# Patient Record
Sex: Female | Born: 1940
Health system: Southern US, Community
[De-identification: ages and names within clinical notes are randomized; demographics above are authoritative.]

## PROBLEM LIST (undated history)

## (undated) DIAGNOSIS — I10 Essential (primary) hypertension: Secondary | ICD-10-CM

## (undated) DIAGNOSIS — E039 Hypothyroidism, unspecified: Secondary | ICD-10-CM

## (undated) HISTORY — DX: Hypothyroidism, unspecified: E03.9

## (undated) HISTORY — DX: Essential (primary) hypertension: I10

---

## 2005-04-19 ENCOUNTER — Inpatient Hospital Stay (HOSPITAL_COMMUNITY): Admission: EM | Admit: 2005-04-19 | Discharge: 2005-04-24 | Payer: Self-pay | Admitting: Emergency Medicine

## 2005-04-20 ENCOUNTER — Ambulatory Visit: Payer: Self-pay | Admitting: Cardiology

## 2012-04-21 ENCOUNTER — Other Ambulatory Visit: Payer: Self-pay | Admitting: Family Medicine

## 2012-04-21 DIAGNOSIS — E049 Nontoxic goiter, unspecified: Secondary | ICD-10-CM

## 2012-05-06 ENCOUNTER — Ambulatory Visit
Admission: RE | Admit: 2012-05-06 | Discharge: 2012-05-06 | Disposition: A | Discharge status: Home / Self Care | Payer: Medicare Other | Source: Ambulatory Visit | Attending: Family Medicine | Admitting: Family Medicine

## 2012-05-06 DIAGNOSIS — E049 Nontoxic goiter, unspecified: Secondary | ICD-10-CM

## 2012-05-19 ENCOUNTER — Other Ambulatory Visit: Payer: Self-pay | Admitting: Family Medicine

## 2012-05-19 DIAGNOSIS — E041 Nontoxic single thyroid nodule: Secondary | ICD-10-CM

## 2012-05-22 ENCOUNTER — Other Ambulatory Visit (HOSPITAL_COMMUNITY)
Admission: RE | Admit: 2012-05-22 | Discharge: 2012-05-22 | Disposition: A | Discharge status: Home / Self Care | Payer: Medicare Other | Source: Ambulatory Visit | Attending: Interventional Radiology | Admitting: Interventional Radiology

## 2012-05-22 ENCOUNTER — Ambulatory Visit
Admission: RE | Admit: 2012-05-22 | Discharge: 2012-05-22 | Disposition: A | Discharge status: Home / Self Care | Payer: Medicare Other | Source: Ambulatory Visit | Attending: Family Medicine | Admitting: Family Medicine

## 2012-05-22 DIAGNOSIS — E041 Nontoxic single thyroid nodule: Secondary | ICD-10-CM | POA: Insufficient documentation

## 2013-09-04 IMAGING — US US THYROID BIOPSY
1 series · 7 of 7 positions shown · non-contrast
Comparison: none

INDICATION: Indeterminate nodule within the thyroid isthmus

[Series 1: us thyroid biopsy · 0.07mm/px · 7 acquisitions, 7 frames shown]
[im 1/7]
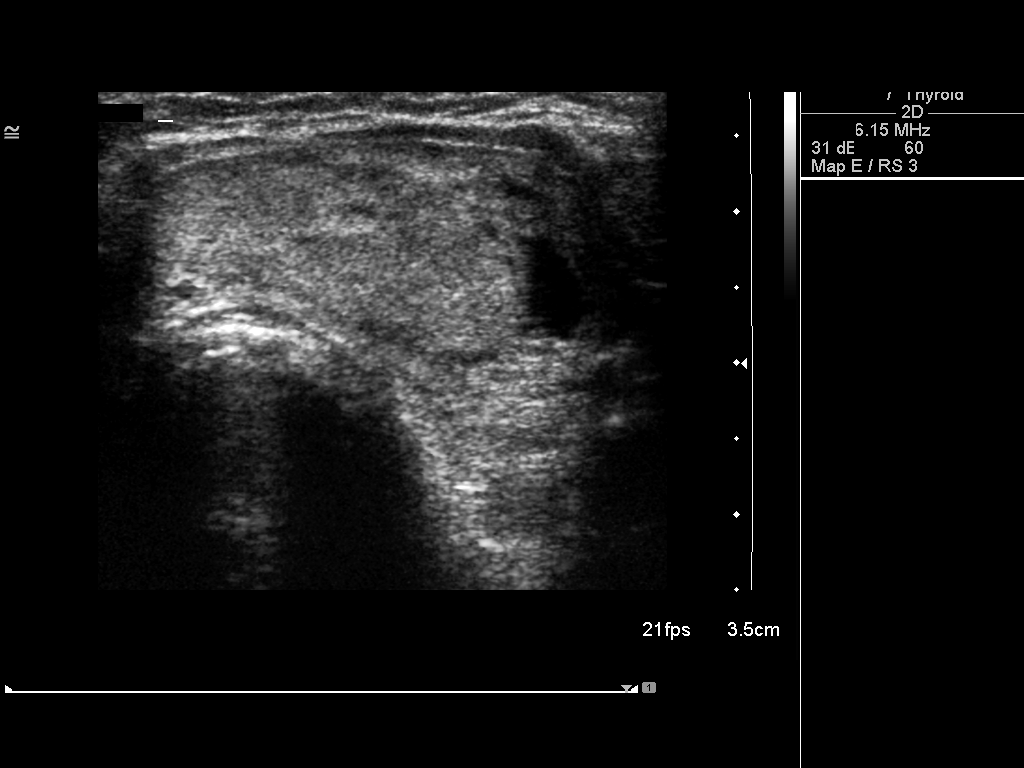
[im 2/7]
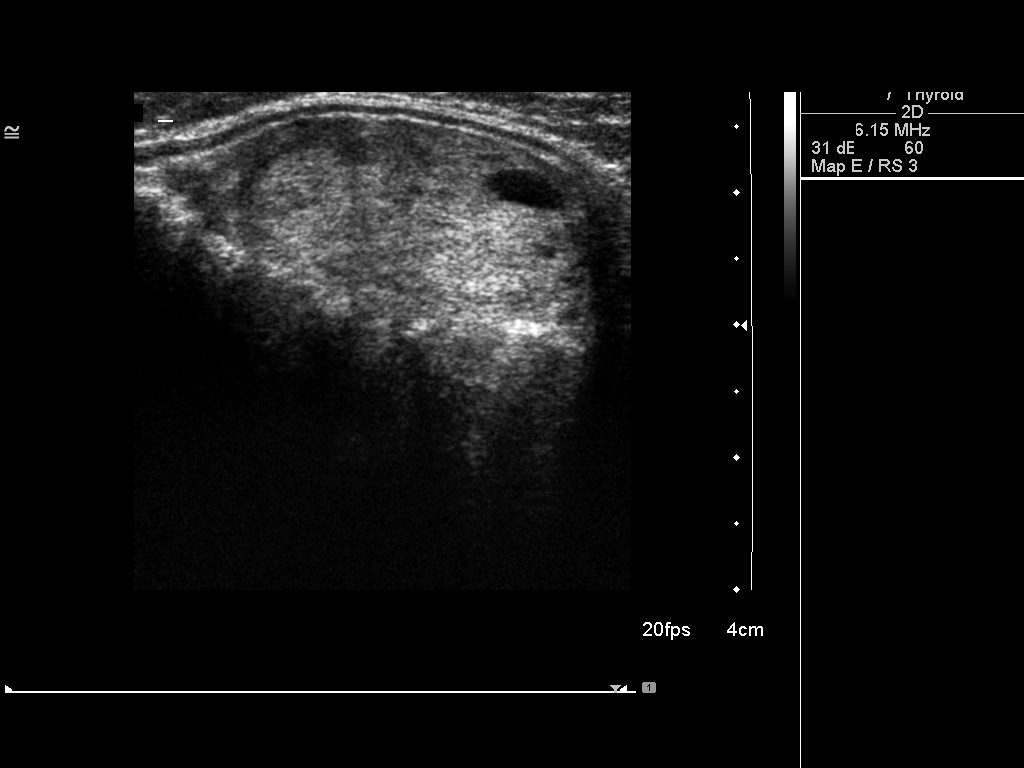
[im 3/7]
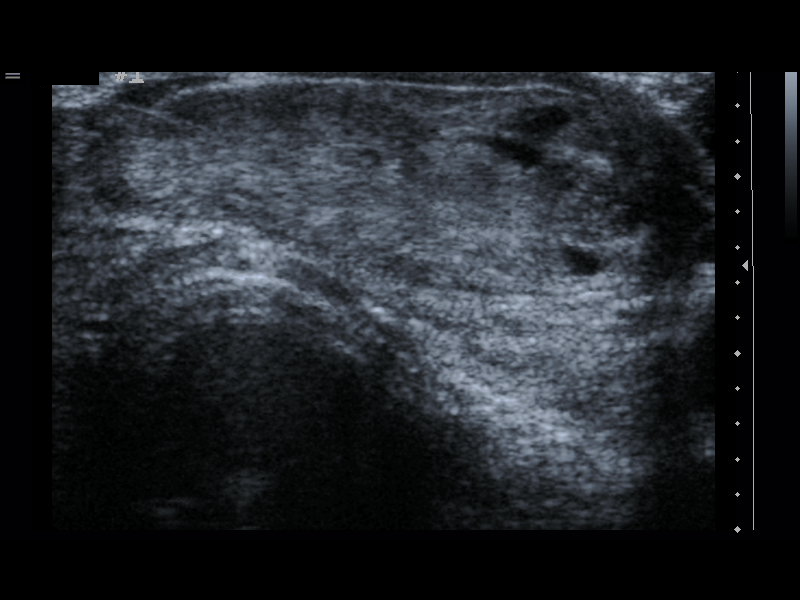
[im 4/7]
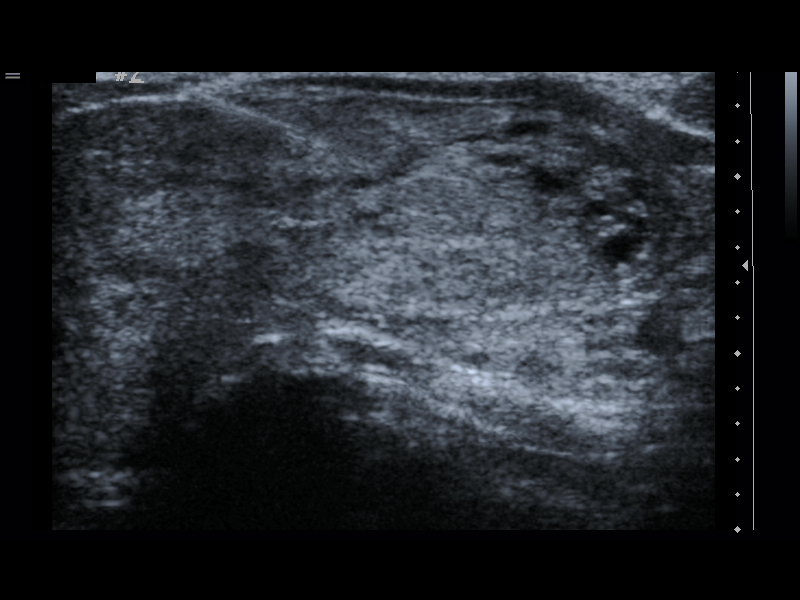
[im 5/7]
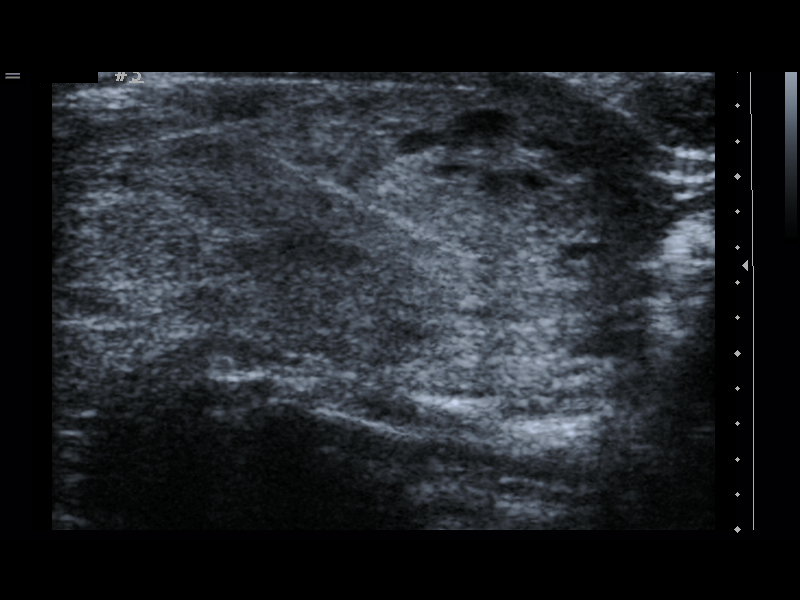
[im 6/7]
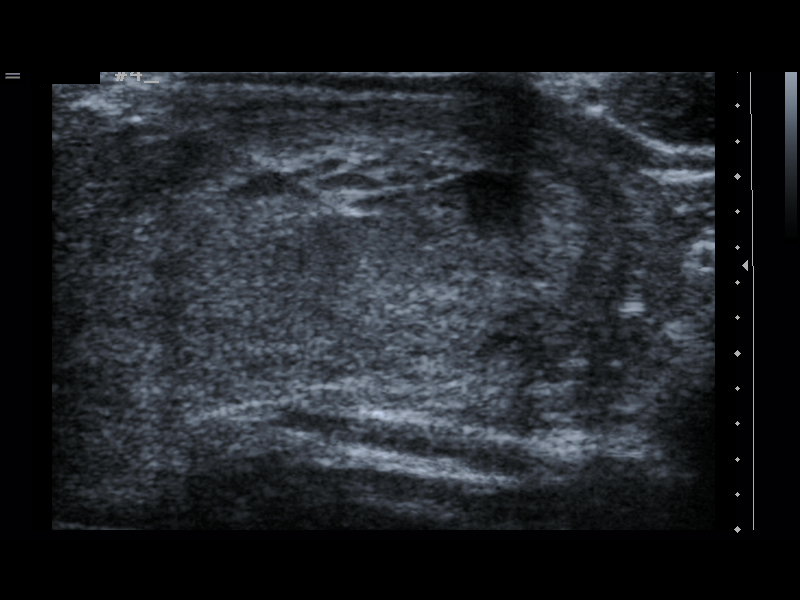
[im 7/7]
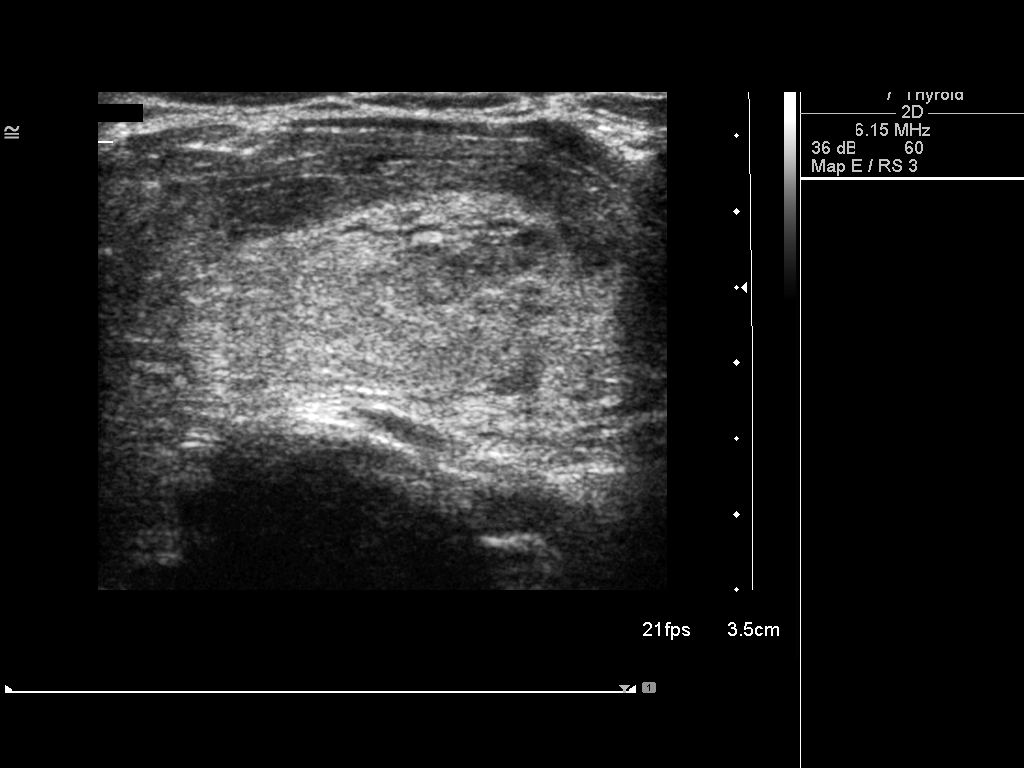

[7 of 7 positions shown; findings below may reference images not displayed]

ULTRASOUND GUIDED THYROID FINE NEEDLE ASPIRATION

Comparisons: Thyroid Ultrasound - 05/06/2012

Intravenous Medications: None

Complications: None immediate

Technique / Findings:

Informed written consent was obtained from the patient after a
discussion of the risks, benefits and alternatives to treatment.
Questions regarding the procedure were encouraged and answered.  A
timeout was performed prior to the initiation of the procedure.

Pre-procedural ultrasound scanning demonstrated grossly unchanged
appearance of dominant partially cystic, predominantly solid nodule
within the thyroid isthmus.  The procedure was planned.  The neck
was prepped in the usual sterile fashion, and a sterile drape was
applied covering the operative field.  A timeout was performed
prior to the initiation of the procedure.  Local anesthesia was
provided with 1% lidocaine.

Under direct ultrasound guidance, 4 FNA biopsies were performed of
nodule with a 25 gauge needle.  The samples were prepared and
submitted to pathology.  Limited post procedural scanning was
negative for hematoma or additional complication.  A dressing was
placed.  The patient tolerated procedure well without immediate
postprocedural complication.
IMPRESSION: Technically successful ultrasound guided fine needle aspiration of
dominant, partially cystic, predominantly solid nodule within the
thyroid isthmus.

## 2014-02-04 ENCOUNTER — Other Ambulatory Visit: Payer: Self-pay | Admitting: Family Medicine

## 2014-02-04 ENCOUNTER — Ambulatory Visit
Admission: RE | Admit: 2014-02-04 | Discharge: 2014-02-04 | Disposition: A | Discharge status: Home / Self Care | Payer: Medicare Other | Source: Ambulatory Visit | Attending: Family Medicine | Admitting: Family Medicine

## 2014-02-04 DIAGNOSIS — R059 Cough, unspecified: Secondary | ICD-10-CM

## 2014-02-04 DIAGNOSIS — R05 Cough: Secondary | ICD-10-CM

## 2014-09-06 ENCOUNTER — Other Ambulatory Visit: Payer: Self-pay | Admitting: Family Medicine

## 2014-09-06 DIAGNOSIS — E049 Nontoxic goiter, unspecified: Secondary | ICD-10-CM

## 2014-09-09 ENCOUNTER — Other Ambulatory Visit: Payer: Self-pay

## 2014-10-05 ENCOUNTER — Ambulatory Visit
Admission: RE | Admit: 2014-10-05 | Discharge: 2014-10-05 | Disposition: A | Discharge status: Home / Self Care | Payer: Medicare Other | Source: Ambulatory Visit | Attending: Family Medicine | Admitting: Family Medicine

## 2014-10-05 DIAGNOSIS — E049 Nontoxic goiter, unspecified: Secondary | ICD-10-CM

## 2016-01-18 IMAGING — US US SOFT TISSUE HEAD/NECK
1 series · 13 of 25 positions shown · non-contrast
Comparison: 05/06/2012; ultrasound-guided thyroid isthmic nodule
biopsy - 05/22/2012

CLINICAL DATA: Thyroid goiter. History of prior biopsy of dominant
isthmic nodule.

EXAM:
THYROID ULTRASOUND
TECHNIQUE: Ultrasound examination of the thyroid gland and adjacent soft
tissues was performed.

[Series 1: us soft tissue head/neck · 0.08mm/px · 13 of 40 slices shown]
[im 1/40]
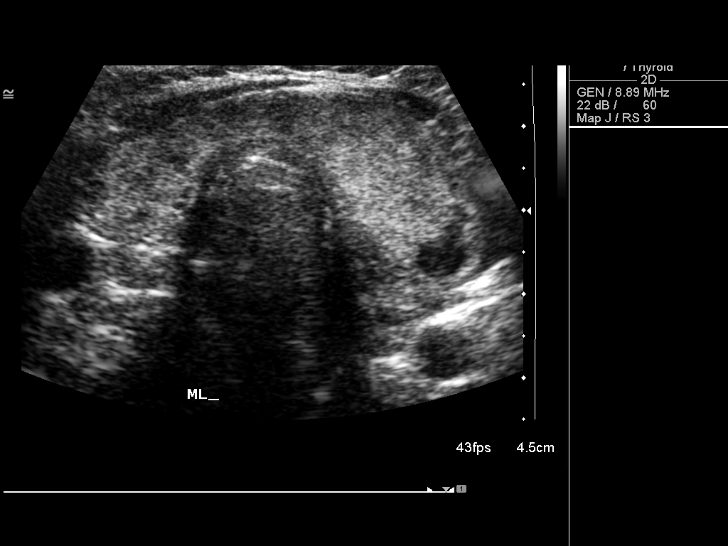
[im 4/40]
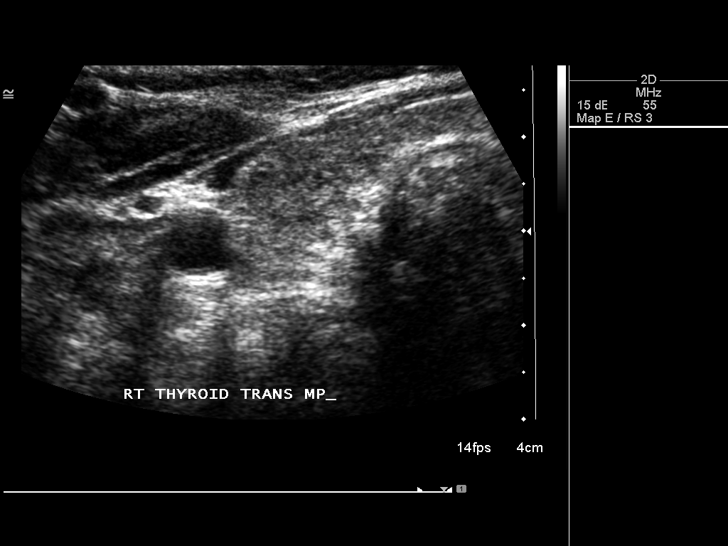
[im 7/40]
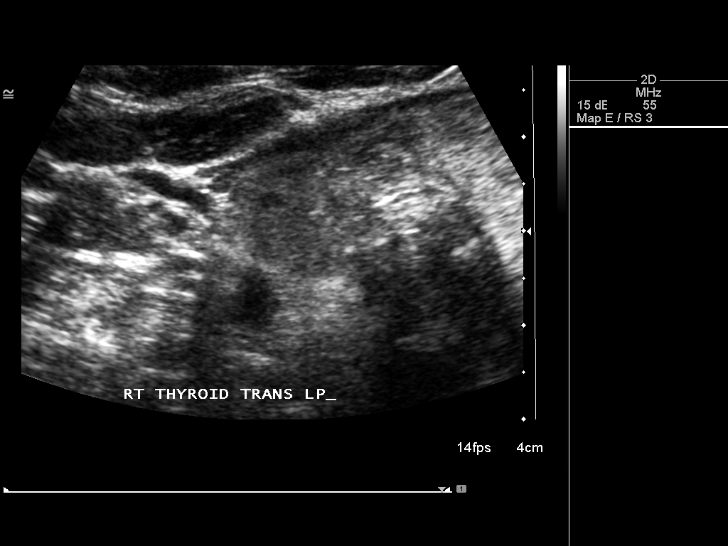
[im 10/40]
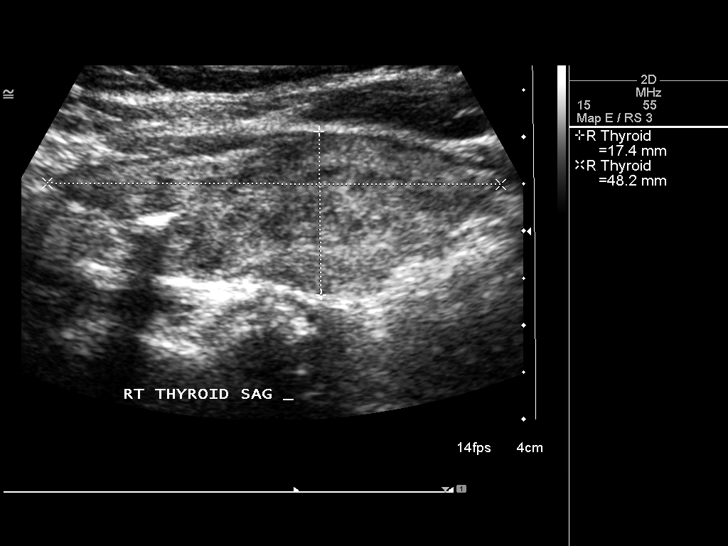
[im 14/40]
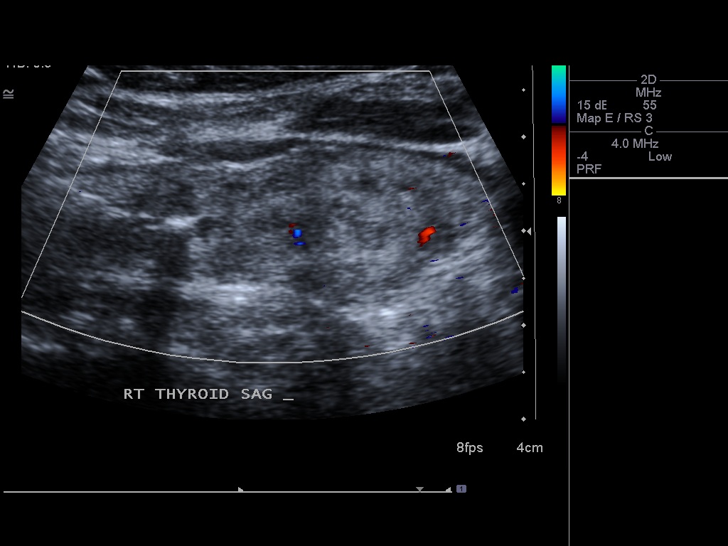
[im 17/40]
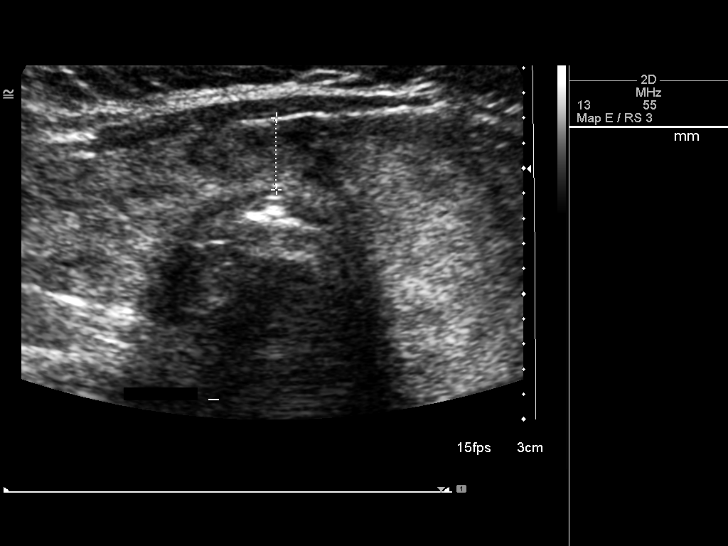
[im 20/40]
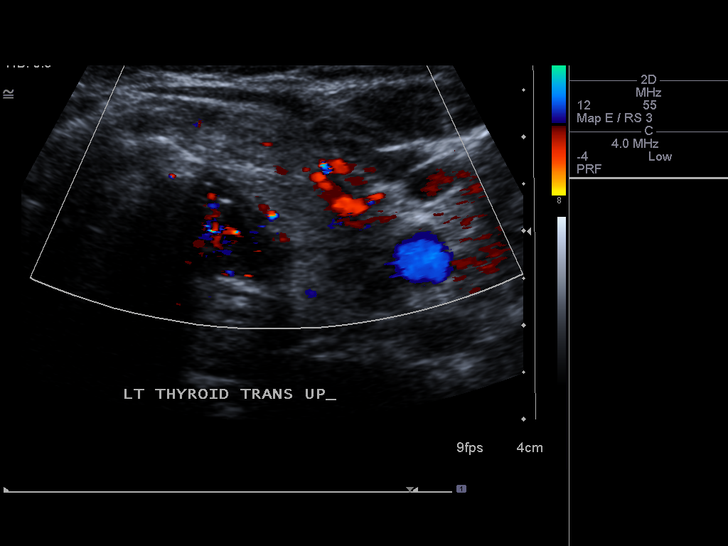
[im 23/40]
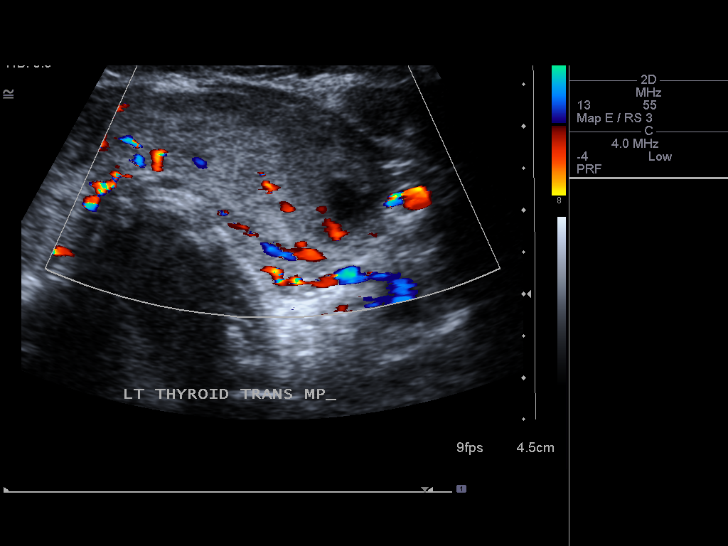
[im 27/40]
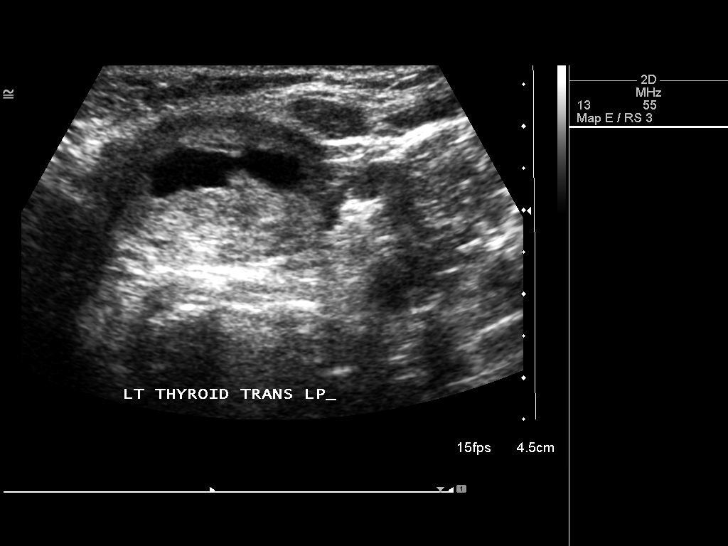
[im 30/40]
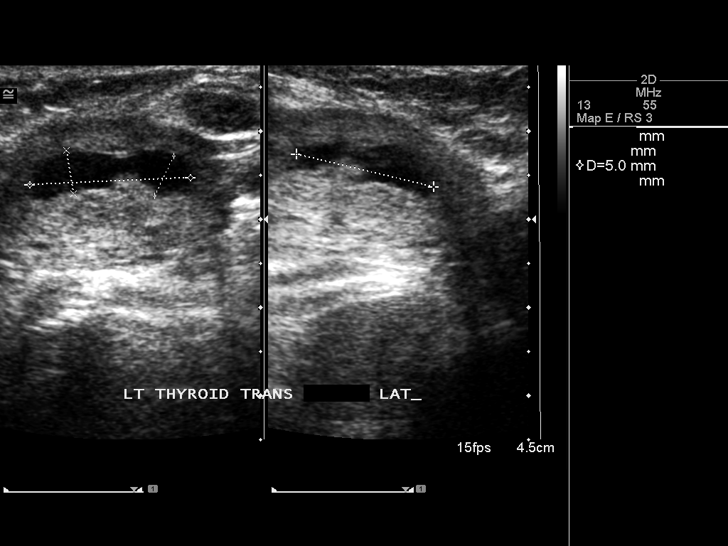
[im 33/40]
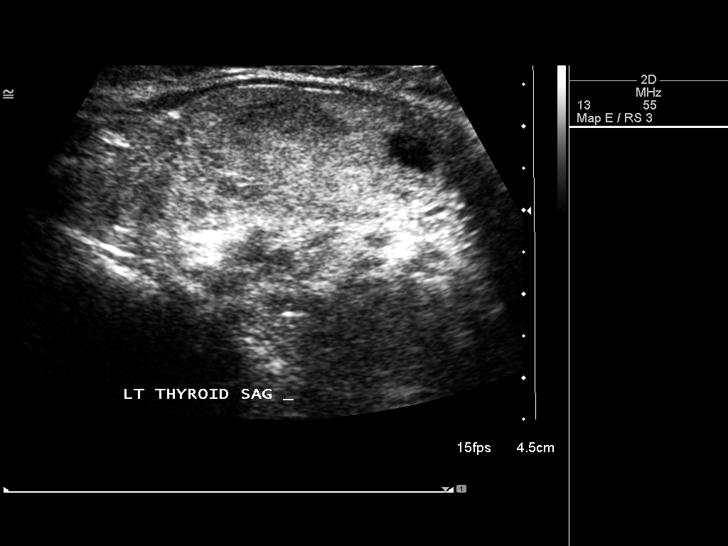
[im 36/40]
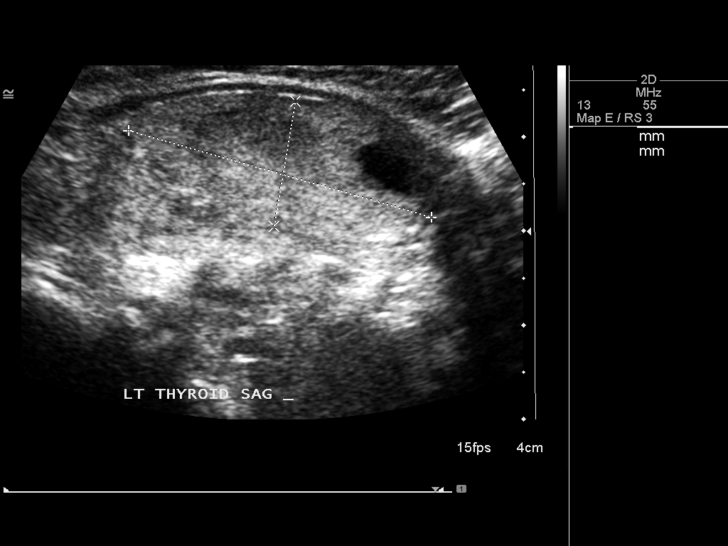
[im 40/40]
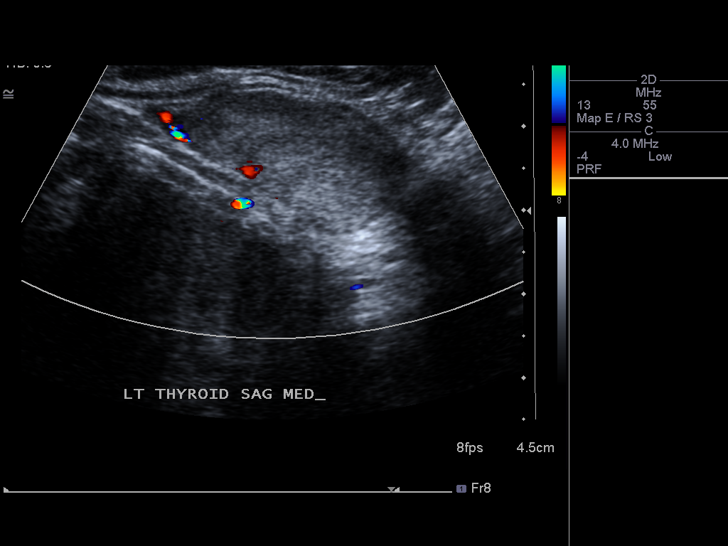

[13 of 25 positions shown; findings below may reference images not displayed]

FINDINGS: There is unchanged mild diffuse heterogeneity of the thyroid
parenchymal echotexture. No definitive new or enlarging thyroid
nodules.

Right thyroid lobe

Measurements: Normal in size measuring 5.2 x 1.7 x 1.6 cm.

Right, superior - 0.6 x 0.2 x 0.6 cm - echogenic, shadowing, likely
a dystrophic calcification - grossly unchanged since the 05/06/2012
examination, previously, 0.5 cm.

Left thyroid lobe

Measurements: Normal in size measuring 5.2 x 1.7 x 1.6 cm.

Left, superior- 1.0 x 1.6 x 0.6 cm - anechoic, cystic - this nodule
is favored to be a component of the dominant nodule/mass within the
left side of the thyroid isthmus.

Left, inferior - 1.6 x 0.5 x 1.8 cm - anechoic, serpiginous,
prominently cystic - unchanged in hind site

Isthmus

Thickness: Enlarged measuring 0.6 cm in diameter.

Interval decrease in size of previously noted dominant nodule within
the left lobe of the thyroid isthmus, currently measuring 3.3 x
x 2.6 cm, previously, 3.2 x 3.3 x 1.3 cm

Lymphadenopathy

None visualized.
IMPRESSION: Similar findings suggestive of multi nodular goiter. No definitive
new or enlarging thyroid nodules. Interval decrease in size of
previously biopsied dominant nodule/mass within left side of the
thyroid isthmus. Correlation with prior biopsy results is
recommended.

## 2016-11-27 DIAGNOSIS — M170 Bilateral primary osteoarthritis of knee: Secondary | ICD-10-CM | POA: Diagnosis not present

## 2016-11-27 DIAGNOSIS — M25562 Pain in left knee: Secondary | ICD-10-CM | POA: Diagnosis not present

## 2016-11-27 DIAGNOSIS — M25561 Pain in right knee: Secondary | ICD-10-CM | POA: Diagnosis not present

## 2017-01-09 DIAGNOSIS — M170 Bilateral primary osteoarthritis of knee: Secondary | ICD-10-CM | POA: Diagnosis not present

## 2017-01-09 DIAGNOSIS — M1712 Unilateral primary osteoarthritis, left knee: Secondary | ICD-10-CM | POA: Diagnosis not present

## 2017-01-18 DIAGNOSIS — M170 Bilateral primary osteoarthritis of knee: Secondary | ICD-10-CM | POA: Diagnosis not present

## 2017-01-25 DIAGNOSIS — M170 Bilateral primary osteoarthritis of knee: Secondary | ICD-10-CM | POA: Diagnosis not present

## 2017-01-31 DIAGNOSIS — R69 Illness, unspecified: Secondary | ICD-10-CM | POA: Diagnosis not present

## 2017-03-15 DIAGNOSIS — E039 Hypothyroidism, unspecified: Secondary | ICD-10-CM | POA: Diagnosis not present

## 2017-03-15 DIAGNOSIS — E7800 Pure hypercholesterolemia, unspecified: Secondary | ICD-10-CM | POA: Diagnosis not present

## 2017-03-15 DIAGNOSIS — E049 Nontoxic goiter, unspecified: Secondary | ICD-10-CM | POA: Diagnosis not present

## 2017-03-15 DIAGNOSIS — R7301 Impaired fasting glucose: Secondary | ICD-10-CM | POA: Diagnosis not present

## 2017-03-15 DIAGNOSIS — M1990 Unspecified osteoarthritis, unspecified site: Secondary | ICD-10-CM | POA: Diagnosis not present

## 2017-03-15 DIAGNOSIS — I10 Essential (primary) hypertension: Secondary | ICD-10-CM | POA: Diagnosis not present

## 2017-03-15 DIAGNOSIS — Z0000 Encounter for general adult medical examination without abnormal findings: Secondary | ICD-10-CM | POA: Diagnosis not present

## 2017-07-31 DIAGNOSIS — J069 Acute upper respiratory infection, unspecified: Secondary | ICD-10-CM | POA: Diagnosis not present

## 2017-09-27 ENCOUNTER — Other Ambulatory Visit: Payer: Self-pay | Admitting: Family Medicine

## 2017-09-27 DIAGNOSIS — I10 Essential (primary) hypertension: Secondary | ICD-10-CM | POA: Diagnosis not present

## 2017-09-27 DIAGNOSIS — E049 Nontoxic goiter, unspecified: Secondary | ICD-10-CM

## 2017-09-27 DIAGNOSIS — R7301 Impaired fasting glucose: Secondary | ICD-10-CM | POA: Diagnosis not present

## 2017-09-27 DIAGNOSIS — E7800 Pure hypercholesterolemia, unspecified: Secondary | ICD-10-CM | POA: Diagnosis not present

## 2017-09-27 DIAGNOSIS — E039 Hypothyroidism, unspecified: Secondary | ICD-10-CM | POA: Diagnosis not present

## 2017-09-27 DIAGNOSIS — M1990 Unspecified osteoarthritis, unspecified site: Secondary | ICD-10-CM | POA: Diagnosis not present

## 2017-10-03 ENCOUNTER — Other Ambulatory Visit: Payer: Medicare Other

## 2017-10-15 ENCOUNTER — Ambulatory Visit
Admission: RE | Admit: 2017-10-15 | Discharge: 2017-10-15 | Disposition: A | Discharge status: Home / Self Care | Payer: Medicare HMO | Source: Ambulatory Visit | Attending: Family Medicine | Admitting: Family Medicine

## 2017-10-15 DIAGNOSIS — E049 Nontoxic goiter, unspecified: Secondary | ICD-10-CM

## 2017-10-15 DIAGNOSIS — E042 Nontoxic multinodular goiter: Secondary | ICD-10-CM | POA: Diagnosis not present

## 2017-12-31 DIAGNOSIS — M1711 Unilateral primary osteoarthritis, right knee: Secondary | ICD-10-CM | POA: Diagnosis not present

## 2017-12-31 DIAGNOSIS — M1712 Unilateral primary osteoarthritis, left knee: Secondary | ICD-10-CM | POA: Diagnosis not present

## 2018-01-07 DIAGNOSIS — M170 Bilateral primary osteoarthritis of knee: Secondary | ICD-10-CM | POA: Diagnosis not present

## 2018-01-14 DIAGNOSIS — M170 Bilateral primary osteoarthritis of knee: Secondary | ICD-10-CM | POA: Diagnosis not present

## 2018-01-20 DIAGNOSIS — R69 Illness, unspecified: Secondary | ICD-10-CM | POA: Diagnosis not present

## 2018-01-29 DIAGNOSIS — R69 Illness, unspecified: Secondary | ICD-10-CM | POA: Diagnosis not present

## 2018-02-04 DIAGNOSIS — R69 Illness, unspecified: Secondary | ICD-10-CM | POA: Diagnosis not present

## 2018-02-24 DIAGNOSIS — R69 Illness, unspecified: Secondary | ICD-10-CM | POA: Diagnosis not present

## 2018-04-04 DIAGNOSIS — E049 Nontoxic goiter, unspecified: Secondary | ICD-10-CM | POA: Diagnosis not present

## 2018-04-04 DIAGNOSIS — I10 Essential (primary) hypertension: Secondary | ICD-10-CM | POA: Diagnosis not present

## 2018-04-04 DIAGNOSIS — E7800 Pure hypercholesterolemia, unspecified: Secondary | ICD-10-CM | POA: Diagnosis not present

## 2018-04-04 DIAGNOSIS — R7301 Impaired fasting glucose: Secondary | ICD-10-CM | POA: Diagnosis not present

## 2018-04-04 DIAGNOSIS — E039 Hypothyroidism, unspecified: Secondary | ICD-10-CM | POA: Diagnosis not present

## 2018-04-04 DIAGNOSIS — M1990 Unspecified osteoarthritis, unspecified site: Secondary | ICD-10-CM | POA: Diagnosis not present

## 2018-04-04 DIAGNOSIS — Z0000 Encounter for general adult medical examination without abnormal findings: Secondary | ICD-10-CM | POA: Diagnosis not present

## 2018-04-04 DIAGNOSIS — Z23 Encounter for immunization: Secondary | ICD-10-CM | POA: Diagnosis not present

## 2018-04-07 DIAGNOSIS — M1712 Unilateral primary osteoarthritis, left knee: Secondary | ICD-10-CM | POA: Diagnosis not present

## 2018-04-07 DIAGNOSIS — M1711 Unilateral primary osteoarthritis, right knee: Secondary | ICD-10-CM | POA: Diagnosis not present

## 2018-07-01 DIAGNOSIS — R69 Illness, unspecified: Secondary | ICD-10-CM | POA: Diagnosis not present

## 2018-09-15 DIAGNOSIS — M170 Bilateral primary osteoarthritis of knee: Secondary | ICD-10-CM | POA: Diagnosis not present

## 2018-09-15 DIAGNOSIS — M1712 Unilateral primary osteoarthritis, left knee: Secondary | ICD-10-CM | POA: Diagnosis not present

## 2018-09-15 DIAGNOSIS — M1711 Unilateral primary osteoarthritis, right knee: Secondary | ICD-10-CM | POA: Diagnosis not present

## 2018-09-30 DIAGNOSIS — R7301 Impaired fasting glucose: Secondary | ICD-10-CM | POA: Diagnosis not present

## 2018-09-30 DIAGNOSIS — E7800 Pure hypercholesterolemia, unspecified: Secondary | ICD-10-CM | POA: Diagnosis not present

## 2018-09-30 DIAGNOSIS — I10 Essential (primary) hypertension: Secondary | ICD-10-CM | POA: Diagnosis not present

## 2018-09-30 DIAGNOSIS — E039 Hypothyroidism, unspecified: Secondary | ICD-10-CM | POA: Diagnosis not present

## 2018-09-30 DIAGNOSIS — Z7189 Other specified counseling: Secondary | ICD-10-CM | POA: Diagnosis not present

## 2018-10-03 ENCOUNTER — Other Ambulatory Visit: Payer: Self-pay | Admitting: Family Medicine

## 2018-10-03 DIAGNOSIS — E049 Nontoxic goiter, unspecified: Secondary | ICD-10-CM

## 2018-11-06 ENCOUNTER — Telehealth: Payer: Self-pay | Admitting: *Deleted

## 2018-11-06 NOTE — Telephone Encounter (Signed)
Left message informing pt I would have one of the schedulers call to set up her appt with one of the doctors specifically assigned to trim toenails.

## 2018-11-06 NOTE — Telephone Encounter (Signed)
Pt request appt to have toenails trimmed states she is unable to reach them.

## 2018-11-07 NOTE — Telephone Encounter (Signed)
I called pt and she stated she is scheduled Tuesday and wanted to know what to expect with the Covid precautions. I told pt she would need to wear a mask, we are following the CDC precautions and also working to decrease waiting times in the lobby. Pt thanked me for the call and information.

## 2018-11-07 NOTE — Telephone Encounter (Signed)
Pt called states she is sorry she missed my call but also has questions.

## 2018-11-10 NOTE — Telephone Encounter (Signed)
Pt called back to give the nurse her pharmacy information. Pt uses [facility] on [address] [phone].

## 2018-11-11 ENCOUNTER — Other Ambulatory Visit: Payer: Self-pay

## 2018-11-11 ENCOUNTER — Encounter: Payer: Self-pay | Admitting: Podiatry

## 2018-11-11 ENCOUNTER — Ambulatory Visit: Payer: Medicare HMO | Admitting: Podiatry

## 2018-11-11 VITALS — Temp 97.9°F

## 2018-11-11 DIAGNOSIS — M79674 Pain in right toe(s): Secondary | ICD-10-CM | POA: Diagnosis not present

## 2018-11-11 DIAGNOSIS — M79675 Pain in left toe(s): Secondary | ICD-10-CM | POA: Diagnosis not present

## 2018-11-11 DIAGNOSIS — B351 Tinea unguium: Secondary | ICD-10-CM | POA: Diagnosis not present

## 2018-11-11 NOTE — Patient Instructions (Signed)

## 2018-11-16 ENCOUNTER — Encounter: Payer: Self-pay | Admitting: Podiatry

## 2018-11-16 NOTE — Progress Notes (Signed)
Subjective: Linda Callahan presents today referred by [name], Kimberlee, MD with cc of painful, discolored, thick toenails x several months which interfere with daily activities.  Pain is aggravated when wearing enclosed shoe gear. She has had pedicures in the past.   Her husband has passed away and he was a former patient of Drs. Regal and Evans.  Past Medical History:  Diagnosis Date  . HTN (hypertension)   . Hypothyroidism      There are no active problems to display for this patient.    History reviewed. No pertinent surgical history.    Current Outpatient Medications:  .  atenolol (TENORMIN) 100 MG tablet, , Disp: , Rfl:  .  diclofenac sodium (VOLTAREN) 1 % GEL, , Disp: , Rfl:  .  levothyroxine (SYNTHROID) 75 MCG tablet, levothyroxine 75 mcg tablet, Disp: , Rfl:  .  lisinopril (ZESTRIL) 20 MG tablet, lisinopril 20 mg tablet, Disp: , Rfl:  .  simvastatin (ZOCOR) 20 MG tablet, , Disp: , Rfl:    Allergies  Allergen Reactions  . Glucosamine   . Penicillins      Social History   Occupational History  . Not on file  Tobacco Use  . Smoking status: Never Smoker  . Smokeless tobacco: Never Used  Substance and Sexual Activity  . Alcohol use: Not on file  . Drug use: Never  . Sexual activity: Not on file     History reviewed. No pertinent family history.    There is no immunization history on file for this patient.   Review of systems: Positive Findings in bold print.  Constitutional:  chills, fatigue, fever, sweats, weight change Communication: Nurse, learning disabilitytranslator, sign Presenter, broadcastinglanguage translator, hand writing, iPad/Android device Head: headaches, head injury Eyes: changes in vision, eye pain, glaucoma, cataracts, macular degeneration, diplopia, glare,  light sensitivity, eyeglasses or contacts, blindness Ears nose mouth throat: hearing impaired, hearing aids,  ringing in ears, deaf, sign language,  vertigo,   nosebleeds,  rhinitis,  cold sores, snoring, swollen glands Cardiovascular:  HTN, edema, arrhythmia, pacemaker in place, defibrillator in place, chest pain/tightness, chronic anticoagulation, blood clot, heart failure, MI Peripheral Vascular: leg cramps, varicose veins, blood clots, lymphedema, varicosities Respiratory:  difficulty breathing, denies congestion, SOB, wheezing, cough, emphysema Gastrointestinal: change in appetite or weight, abdominal pain, constipation, diarrhea, nausea, vomiting, vomiting blood, change in bowel habits, abdominal pain, jaundice, rectal bleeding, hemorrhoids, GERD Genitourinary:  nocturia,  pain on urination, polyuria,  blood in urine, Foley catheter, urinary urgency, ESRD on hemodialysis Musculoskeletal: amputation, cramping, stiff joints, painful joints, decreased joint motion, fractures, OA, gout, hemiplegia, paraplegia, uses cane, wheelchair bound, uses walker, uses rollator Skin: +changes in toenails, color change, dryness, itching, mole changes,  rash, wound(s) Neurological: headaches, numbness in feet, paresthesias in feet, burning in feet, fainting,  seizures, change in speech. denies headaches, memory problems/poor historian, cerebral palsy, weakness, paralysis, CVA, TIA Endocrine: diabetes, hypothyroidism, hyperthyroidism,  goiter, dry mouth, flushing, heat intolerance,  cold intolerance,  excessive thirst, denies polyuria,  nocturia Hematological:  easy bleeding, excessive bleeding, easy bruising, enlarged lymph nodes, on long term blood thinner, history of past transusions Allergy/immunological:  hives, eczema, frequent infections, multiple drug allergies, seasonal allergies, transplant recipient, multiple food allergies Psychiatric:  anxiety, depression, mood disorder, suicidal ideations, hallucinations, insomnia  Objective: Vitals:   11/11/18 1515  Temp: 97.9 F (36.6 C)    Vascular Examination: Capillary refill time immediate x 10 digits.  Dorsalis pedis pulses palpable b/l.   Posterior tibial pulses palpable b/l.   No  digital hair x 10 digits.  Skin temperature gradient WNL b/l.  Dermatological Examination: Skin with normal turgor, texture and tone b/l.  Toenails 1-5 b/l discolored, thick, dystrophic with subungual debris and pain with palpation to nailbeds due to thickness of nails.  Musculoskeletal: Muscle strength 5/5 to all LE muscle groups.  Hammertoes lesser digits b/l.  Neurological: Sensation intact 5/5 b/l with 10 gram monofilament.  Vibratory sensation intact b/l.  Assessment: 1. Painful onychomycosis toenails 1-5 b/l   Plan: 1. Discussed onychomycosis and treatment options.  Literature dispensed on today. 2. Toenails 1-5 b/l were debrided in length and girth without iatrogenic bleeding. 3. Patient to continue soft, supportive shoe gear daily. 4. Patient to report any pedal injuries to medical professional immediately. 5. Follow up 3 months.  6. Patient/POA to call should there be a concern in the interim.

## 2019-01-31 DIAGNOSIS — Z23 Encounter for immunization: Secondary | ICD-10-CM | POA: Diagnosis not present

## 2019-02-11 ENCOUNTER — Encounter: Payer: Self-pay | Admitting: Podiatry

## 2019-02-11 ENCOUNTER — Other Ambulatory Visit: Payer: Self-pay

## 2019-02-11 ENCOUNTER — Ambulatory Visit: Payer: Medicare HMO | Admitting: Podiatry

## 2019-02-11 DIAGNOSIS — M79674 Pain in right toe(s): Secondary | ICD-10-CM

## 2019-02-11 DIAGNOSIS — B351 Tinea unguium: Secondary | ICD-10-CM

## 2019-02-11 DIAGNOSIS — M79675 Pain in left toe(s): Secondary | ICD-10-CM

## 2019-02-11 NOTE — Patient Instructions (Signed)

## 2019-02-16 NOTE — Progress Notes (Signed)
Subjective: Linda Callahan is seen today for follow up painful, elongated, thickened toenails 1-5 b/l feet that she cannot cut. Pain interferes with daily activities. Aggravating factor includes wearing enclosed shoe gear and relieved with periodic debridement.  Current Outpatient Medications on File Prior to Visit  Medication Sig  . atenolol (TENORMIN) 100 MG tablet   . diclofenac sodium (VOLTAREN) 1 % GEL   . levothyroxine (SYNTHROID) 75 MCG tablet levothyroxine 75 mcg tablet  . lisinopril (ZESTRIL) 20 MG tablet lisinopril 20 mg tablet  . simvastatin (ZOCOR) 20 MG tablet    No current facility-administered medications on file prior to visit.      Allergies  Allergen Reactions  . Glucosamine   . Penicillins      Objective:  Vascular Examination: Capillary refill time immediate x 10 digits.  Dorsalis pedis present b/l.  Posterior tibial pulses present b/l.  Digital hair absent b/l.  Skin temperature gradient WNL b/l.   Dermatological Examination: Skin with normal turgor, texture and tone b/l.  Toenails 1-5 b/l discolored, thick, dystrophic with subungual debris and pain with palpation to nailbeds due to thickness of nails.  Musculoskeletal: Muscle strength 5/5 to all LE muscle groups.  Hammertoes lesser digits b/l  No pain, crepitus or joint limitation noted with ROM.   Neurological Examination: Protective sensation intact with 10 gram monofilament bilaterally.    Assessment: Painful onychomycosis toenails 1-5 b/l   Plan: 1. Toenails 1-5 b/l were debrided in length and girth without iatrogenic bleeding. 2. Patient to continue soft, supportive shoe gear 3. Patient to report any pedal injuries to medical professional immediately. 4. Follow up 3 months.  5. Patient/POA to call should there be a concern in the interim.

## 2019-04-28 DIAGNOSIS — E049 Nontoxic goiter, unspecified: Secondary | ICD-10-CM | POA: Diagnosis not present

## 2019-04-28 DIAGNOSIS — E7800 Pure hypercholesterolemia, unspecified: Secondary | ICD-10-CM | POA: Diagnosis not present

## 2019-04-28 DIAGNOSIS — Z0000 Encounter for general adult medical examination without abnormal findings: Secondary | ICD-10-CM | POA: Diagnosis not present

## 2019-04-28 DIAGNOSIS — I10 Essential (primary) hypertension: Secondary | ICD-10-CM | POA: Diagnosis not present

## 2019-04-28 DIAGNOSIS — M1990 Unspecified osteoarthritis, unspecified site: Secondary | ICD-10-CM | POA: Diagnosis not present

## 2019-04-28 DIAGNOSIS — Z7189 Other specified counseling: Secondary | ICD-10-CM | POA: Diagnosis not present

## 2019-04-28 DIAGNOSIS — R7301 Impaired fasting glucose: Secondary | ICD-10-CM | POA: Diagnosis not present

## 2019-04-28 DIAGNOSIS — E039 Hypothyroidism, unspecified: Secondary | ICD-10-CM | POA: Diagnosis not present

## 2019-05-20 ENCOUNTER — Ambulatory Visit (INDEPENDENT_AMBULATORY_CARE_PROVIDER_SITE_OTHER): Payer: Medicare HMO | Admitting: Podiatry

## 2019-05-20 ENCOUNTER — Encounter: Payer: Self-pay | Admitting: Podiatry

## 2019-05-20 ENCOUNTER — Other Ambulatory Visit: Payer: Self-pay

## 2019-05-20 DIAGNOSIS — M79674 Pain in right toe(s): Secondary | ICD-10-CM | POA: Diagnosis not present

## 2019-05-20 DIAGNOSIS — M79675 Pain in left toe(s): Secondary | ICD-10-CM

## 2019-05-20 DIAGNOSIS — B351 Tinea unguium: Secondary | ICD-10-CM

## 2019-05-20 NOTE — Patient Instructions (Signed)

## 2019-05-24 NOTE — Progress Notes (Signed)
Subjective: Linda Callahan presents today for follow up of painful mycotic nails b/l that are difficult to trim. Pain interferes with ambulation. Aggravating factors include wearing enclosed shoe gear. Pain is relieved with periodic professional debridement.   Allergies  Allergen Reactions  . Glucosamine   . Penicillins      Objective: There were no vitals filed for this visit.  Vascular Examination:  capillary refill time to digits immediate b/l, palpable DP pulses b/l, palpable PT pulses b/l, pedal hair absent b/l and skin temperature gradient within normal limits b/l  Dermatological Examination: Pedal skin with normal turgor, texture and tone bilaterally, no open wounds bilaterally, no interdigital macerations bilaterally and toenails 1-5 b/l elongated, dystrophic, thickened, crumbly with subungual debris  Musculoskeletal: normal muscle strength 5/5 to all lower extremity muscle groups bilaterally, no pain crepitus or joint limitation noted with ROM b/l and hammertoes noted to the left, right, 2nd toe, 3rd toe, 4th toe, 5th toe  Neurological: sensation intact 5/5 intact bilaterally with 10g monofilament and vibratory sensation intact  Assessment: 1. Pain due to onychomycosis of toenails of both feet     Plan: -Toenails 1-5 b/l were debrided in length and girth without iatrogenic bleeding. -Patient to continue soft, supportive shoe gear daily. -Patient to report any pedal injuries to medical professional immediately. -Patient/POA to call should there be question/concern in the interim.  Return in about 3 months (around 08/18/2019) for nail trim.

## 2019-08-21 ENCOUNTER — Other Ambulatory Visit: Payer: Self-pay

## 2019-08-21 ENCOUNTER — Ambulatory Visit: Payer: Medicare Other | Admitting: Podiatry

## 2019-08-21 ENCOUNTER — Encounter: Payer: Self-pay | Admitting: Podiatry

## 2019-08-21 VITALS — Temp 97.1°F

## 2019-08-21 DIAGNOSIS — M79675 Pain in left toe(s): Secondary | ICD-10-CM

## 2019-08-21 DIAGNOSIS — M79674 Pain in right toe(s): Secondary | ICD-10-CM | POA: Diagnosis not present

## 2019-08-21 DIAGNOSIS — B351 Tinea unguium: Secondary | ICD-10-CM | POA: Diagnosis not present

## 2019-08-21 NOTE — Patient Instructions (Signed)

## 2019-08-27 NOTE — Progress Notes (Signed)
Subjective:  Linda Callahan presents to clinic today with cc of  painful, thick, discolored, elongated toenails  of both feet that become tender and patient cannot cut because of thickness. Pain is aggravated when wearing enclosed shoe gear and relieved with periodic professional debridement.  Patient voices no new pedal concerns on today's visit.  Medications reviewed in chart.  Allergies  Allergen Reactions  . Glucosamine   . Penicillins      Objective:  Physical Examination:  Vascular Examination: Capillary refill time to digits immediate b/l. Palpable DP pulses b/l. Palpable PT pulses b/l. Pedal hair absent b/l Skin temperature gradient within normal limits b/l. No edema noted b/l.  Dermatological Examination: Pedal skin with normal turgor, texture and tone bilaterally. No open wounds bilaterally. No interdigital macerations bilaterally. Toenails 1-5 b/l elongated, dystrophic, thickened, crumbly with subungual debris and tenderness to dorsal palpation.  Musculoskeletal Examination: Normal muscle strength 5/5 to all lower extremity muscle groups bilaterally. No gross bony deformities bilaterally. No pain crepitus or joint limitation noted with ROM b/l. Hammertoes noted to the L hallux, L 2nd toe, L 3rd toe, L 4th toe and L 5th toe.  Neurological Examination: Protective sensation intact 5/5 intact bilaterally with 10g monofilament b/l. Vibratory sensation intact b/l. Proprioception intact bilaterally. Babinski reflex negative b/l. Clonus negative b/l.  Assessment: 1. Pain due to onychomycosis of toenails of both feet    Plan: -Toenails 1-5 b/l were debrided in length and girth with sterile nail nippers and dremel without iatrogenic bleeding.  -Patient to continue soft, supportive shoe gear daily. -Patient to report any pedal injuries to medical professional immediately. -Patient/POA to call should there be question/concern in the interim.

## 2019-11-20 ENCOUNTER — Encounter: Payer: Self-pay | Admitting: Podiatry

## 2019-11-20 ENCOUNTER — Other Ambulatory Visit: Payer: Self-pay

## 2019-11-20 ENCOUNTER — Ambulatory Visit: Payer: Medicare Other | Admitting: Podiatry

## 2019-11-20 DIAGNOSIS — B351 Tinea unguium: Secondary | ICD-10-CM | POA: Diagnosis not present

## 2019-11-20 DIAGNOSIS — M79675 Pain in left toe(s): Secondary | ICD-10-CM

## 2019-11-20 DIAGNOSIS — M2042 Other hammer toe(s) (acquired), left foot: Secondary | ICD-10-CM

## 2019-11-20 DIAGNOSIS — M79674 Pain in right toe(s): Secondary | ICD-10-CM | POA: Diagnosis not present

## 2019-11-20 DIAGNOSIS — M2041 Other hammer toe(s) (acquired), right foot: Secondary | ICD-10-CM

## 2019-11-20 NOTE — Progress Notes (Signed)
Subjective: Linda Callahan is a pleasant 79 y.o. female patient seen today painful thick toenails that are difficult to trim. Pain interferes with ambulation. Aggravating factors include wearing enclosed shoe gear. Pain is relieved with periodic professional debridement.   Patient states she received injections in both knees and is undergoing physical therapy at home. She voices no new pedal problems on today's visit.  Past Medical History:  Diagnosis Date  . HTN (hypertension)   . Hypothyroidism     There are no problems to display for this patient.   Current Outpatient Medications on File Prior to Visit  Medication Sig Dispense Refill  . atenolol (TENORMIN) 100 MG tablet     . diclofenac sodium (VOLTAREN) 1 % GEL     . levothyroxine (SYNTHROID) 75 MCG tablet levothyroxine 75 mcg tablet    . lisinopril (ZESTRIL) 20 MG tablet lisinopril 20 mg tablet    . simvastatin (ZOCOR) 20 MG tablet      No current facility-administered medications on file prior to visit.    Allergies  Allergen Reactions  . Glucosamine   . Penicillins     Objective: Physical Exam  General: Linda Callahan is a pleasant 79 y.o. Caucasian female, WD, WN in NAD. AAO x 3.   Vascular:  Neurovascular status unchanged b/l lower extremities. Capillary refill time to digits immediate b/l. Palpable pedal pulses b/l LE. Pedal hair absent. Lower extremity skin temperature gradient within normal limits. No pain with calf compression b/l. No edema noted b/l lower extremities. No ischemia or gangrene noted b/l lower extremities.  Dermatological:  Pedal skin with normal turgor, texture and tone bilaterally. No open wounds bilaterally. No interdigital macerations bilaterally. Toenails 1-5 b/l elongated, discolored, dystrophic, thickened, crumbly with subungual debris and tenderness to dorsal palpation.  Musculoskeletal:  Normal muscle strength 5/5 to all lower extremity muscle groups bilaterally. No pain crepitus or  joint limitation noted with ROM b/l. Hammertoes noted to the 1-5 left. Utilizes cane for ambulation assistance.  Neurological:  Protective sensation intact 5/5 intact bilaterally with 10g monofilament b/l. Vibratory sensation intact b/l. Proprioception intact bilaterally.  Assessment and Plan:  1. Pain due to onychomycosis of toenails of both feet   2. Acquired hammertoes of both feet    -Examined patient. -No new findings. No new orders. -Toenails 1-5 b/l were debrided in length and girth with sterile nail nippers and dremel without iatrogenic bleeding.  -Patient to report any pedal injuries to medical professional immediately. -Patient to continue soft, supportive shoe gear daily. -Patient/POA to call should there be question/concern in the interim.  Return in about 3 months (around 02/20/2020) for nail trim.  Freddie Breech, DPM

## 2020-02-22 ENCOUNTER — Other Ambulatory Visit: Payer: Self-pay

## 2020-02-22 ENCOUNTER — Ambulatory Visit: Payer: Medicare Other | Admitting: Podiatry

## 2020-02-22 ENCOUNTER — Encounter: Payer: Self-pay | Admitting: Podiatry

## 2020-02-22 DIAGNOSIS — M2042 Other hammer toe(s) (acquired), left foot: Secondary | ICD-10-CM

## 2020-02-22 DIAGNOSIS — B351 Tinea unguium: Secondary | ICD-10-CM

## 2020-02-22 DIAGNOSIS — M79675 Pain in left toe(s): Secondary | ICD-10-CM | POA: Diagnosis not present

## 2020-02-22 DIAGNOSIS — M2041 Other hammer toe(s) (acquired), right foot: Secondary | ICD-10-CM

## 2020-02-22 DIAGNOSIS — M79674 Pain in right toe(s): Secondary | ICD-10-CM

## 2020-02-27 NOTE — Progress Notes (Signed)
Subjective: Linda Callahan is a pleasant 79 y.o. female patient seen today painful thick toenails that are difficult to trim. Pain interferes with ambulation. Aggravating factors include wearing enclosed shoe gear. Pain is relieved with periodic professional debridement.    She voices no new pedal problems on today's visit.  Past Medical History:  Diagnosis Date  . HTN (hypertension)   . Hypothyroidism     There are no problems to display for this patient.   Current Outpatient Medications on File Prior to Visit  Medication Sig Dispense Refill  . atenolol (TENORMIN) 100 MG tablet     . diclofenac sodium (VOLTAREN) 1 % GEL     . levothyroxine (SYNTHROID) 75 MCG tablet levothyroxine 75 mcg tablet    . lisinopril (ZESTRIL) 20 MG tablet lisinopril 20 mg tablet    . simvastatin (ZOCOR) 20 MG tablet      No current facility-administered medications on file prior to visit.    Allergies  Allergen Reactions  . Glucosamine   . Penicillins     Objective: Physical Exam  General: Linda Callahan is a pleasant 79 y.o. Caucasian female, WD, WN in NAD. AAO x 3.   Vascular:  Neurovascular status unchanged b/l lower extremities. Capillary refill time to digits immediate b/l. Palpable pedal pulses b/l LE. Pedal hair absent. Lower extremity skin temperature gradient within normal limits. No pain with calf compression b/l. No edema noted b/l lower extremities. No ischemia or gangrene noted b/l lower extremities.  Dermatological:  Pedal skin with normal turgor, texture and tone bilaterally. No open wounds bilaterally. No interdigital macerations bilaterally. Toenails 1-5 b/l elongated, discolored, dystrophic, thickened, crumbly with subungual debris and tenderness to dorsal palpation.  Musculoskeletal:  Normal muscle strength 5/5 to all lower extremity muscle groups bilaterally. No pain crepitus or joint limitation noted with ROM b/l. Hammertoes noted to the 1-5 left. Utilizes cane for ambulation  assistance.  Neurological:  Protective sensation intact 5/5 intact bilaterally with 10g monofilament b/l. Vibratory sensation intact b/l. Proprioception intact bilaterally.  Assessment and Plan:  1. Pain due to onychomycosis of toenails of both feet   2. Acquired hammertoes of both feet    -Examined patient. -No new findings. No new orders. -Toenails 1-5 b/l were debrided in length and girth with sterile nail nippers and dremel without iatrogenic bleeding.  -Patient to report any pedal injuries to medical professional immediately. -Patient to continue soft, supportive shoe gear daily. -Patient/POA to call should there be question/concern in the interim.  Return in about 3 months (around 05/24/2020).  Freddie Breech, DPM

## 2020-05-04 DIAGNOSIS — M179 Osteoarthritis of knee, unspecified: Secondary | ICD-10-CM | POA: Diagnosis not present

## 2020-05-31 ENCOUNTER — Encounter: Payer: Self-pay | Admitting: Podiatry

## 2020-05-31 ENCOUNTER — Other Ambulatory Visit: Payer: Self-pay

## 2020-05-31 ENCOUNTER — Ambulatory Visit: Payer: Medicare Other | Admitting: Podiatry

## 2020-05-31 DIAGNOSIS — M79674 Pain in right toe(s): Secondary | ICD-10-CM | POA: Diagnosis not present

## 2020-05-31 DIAGNOSIS — B351 Tinea unguium: Secondary | ICD-10-CM | POA: Diagnosis not present

## 2020-05-31 DIAGNOSIS — M79675 Pain in left toe(s): Secondary | ICD-10-CM

## 2020-05-31 DIAGNOSIS — J301 Allergic rhinitis due to pollen: Secondary | ICD-10-CM | POA: Insufficient documentation

## 2020-05-31 DIAGNOSIS — E7800 Pure hypercholesterolemia, unspecified: Secondary | ICD-10-CM | POA: Insufficient documentation

## 2020-05-31 DIAGNOSIS — M1990 Unspecified osteoarthritis, unspecified site: Secondary | ICD-10-CM | POA: Insufficient documentation

## 2020-05-31 DIAGNOSIS — M2042 Other hammer toe(s) (acquired), left foot: Secondary | ICD-10-CM

## 2020-05-31 DIAGNOSIS — M2041 Other hammer toe(s) (acquired), right foot: Secondary | ICD-10-CM

## 2020-05-31 DIAGNOSIS — M1711 Unilateral primary osteoarthritis, right knee: Secondary | ICD-10-CM | POA: Insufficient documentation

## 2020-05-31 DIAGNOSIS — R7301 Impaired fasting glucose: Secondary | ICD-10-CM | POA: Insufficient documentation

## 2020-05-31 DIAGNOSIS — E049 Nontoxic goiter, unspecified: Secondary | ICD-10-CM | POA: Insufficient documentation

## 2020-05-31 DIAGNOSIS — I10 Essential (primary) hypertension: Secondary | ICD-10-CM | POA: Insufficient documentation

## 2020-05-31 NOTE — Progress Notes (Signed)
Subjective: Linda Callahan is a pleasant 80 y.o. female patient seen today painful thick toenails that are difficult to trim. Pain interferes with ambulation. Aggravating factors include wearing enclosed shoe gear. Pain is relieved with periodic professional debridement.    She voices no new pedal problems on today's visit.  Past Medical History:  Diagnosis Date  . HTN (hypertension)   . Hypothyroidism     Patient Active Problem List   Diagnosis Date Noted  . Allergic rhinitis due to pollen 05/31/2020  . Arthritis of right knee 05/31/2020  . Essential hypertension 05/31/2020  . Hypercholesterolemia 05/31/2020  . Goiter 05/31/2020  . Impaired fasting glucose 05/31/2020  . Osteoarthritis 05/31/2020    Current Outpatient Medications on File Prior to Visit  Medication Sig Dispense Refill  . atenolol (TENORMIN) 100 MG tablet     . diclofenac sodium (VOLTAREN) 1 % GEL     . levothyroxine (SYNTHROID) 75 MCG tablet levothyroxine 75 mcg tablet    . lisinopril (ZESTRIL) 20 MG tablet lisinopril 20 mg tablet    . simvastatin (ZOCOR) 20 MG tablet     . traMADol (ULTRAM) 50 MG tablet Take 50 mg by mouth 3 (three) times daily as needed.     No current facility-administered medications on file prior to visit.    Allergies  Allergen Reactions  . Glucosamine   . Penicillins   . Vicodin Hp [Hydrocodone-Acetaminophen]     Other reaction(s): nausea and vomiting    Objective: Physical Exam  General: Linda Callahan is a pleasant 80 y.o. Caucasian female, WD, WN in NAD. AAO x 3.   Vascular:  Neurovascular status unchanged b/l lower extremities. Capillary refill time to digits immediate b/l. Palpable pedal pulses b/l LE. Pedal hair absent. Lower extremity skin temperature gradient within normal limits. No pain with calf compression b/l. No edema noted b/l lower extremities. No ischemia or gangrene noted b/l lower extremities.  Dermatological:  Pedal skin with normal turgor, texture and tone  bilaterally. No open wounds bilaterally. No interdigital macerations bilaterally. Toenails 1-5 b/l elongated, discolored, dystrophic, thickened, crumbly with subungual debris and tenderness to dorsal palpation.  Musculoskeletal:  Normal muscle strength 5/5 to all lower extremity muscle groups bilaterally. No pain crepitus or joint limitation noted with ROM b/l. Hammertoes noted to the 1-5 left. Utilizes cane for ambulation assistance.  Neurological:  Protective sensation intact 5/5 intact bilaterally with 10g monofilament b/l. Vibratory sensation intact b/l. Proprioception intact bilaterally.  Assessment and Plan:  1. Pain due to onychomycosis of toenails of both feet   2. Acquired hammertoes of both feet    -Examined patient. -No new findings. No new orders. -Toenails 1-5 b/l were debrided in length and girth with sterile nail nippers and dremel without iatrogenic bleeding.  -Patient to report any pedal injuries to medical professional immediately. -Patient to continue soft, supportive shoe gear daily. -Patient/POA to call should there be question/concern in the interim.  Return in about 3 months (around 08/28/2020).  Freddie Breech, DPM

## 2020-06-04 DIAGNOSIS — M179 Osteoarthritis of knee, unspecified: Secondary | ICD-10-CM | POA: Diagnosis not present

## 2020-07-02 DIAGNOSIS — M179 Osteoarthritis of knee, unspecified: Secondary | ICD-10-CM | POA: Diagnosis not present

## 2020-08-02 DIAGNOSIS — M179 Osteoarthritis of knee, unspecified: Secondary | ICD-10-CM | POA: Diagnosis not present

## 2020-08-03 DIAGNOSIS — I10 Essential (primary) hypertension: Secondary | ICD-10-CM | POA: Diagnosis not present

## 2020-08-03 DIAGNOSIS — Z0000 Encounter for general adult medical examination without abnormal findings: Secondary | ICD-10-CM | POA: Diagnosis not present

## 2020-08-03 DIAGNOSIS — E7800 Pure hypercholesterolemia, unspecified: Secondary | ICD-10-CM | POA: Diagnosis not present

## 2020-08-03 DIAGNOSIS — M1990 Unspecified osteoarthritis, unspecified site: Secondary | ICD-10-CM | POA: Diagnosis not present

## 2020-08-03 DIAGNOSIS — E039 Hypothyroidism, unspecified: Secondary | ICD-10-CM | POA: Diagnosis not present

## 2020-08-03 DIAGNOSIS — R7301 Impaired fasting glucose: Secondary | ICD-10-CM | POA: Diagnosis not present

## 2020-09-01 DIAGNOSIS — M179 Osteoarthritis of knee, unspecified: Secondary | ICD-10-CM | POA: Diagnosis not present

## 2020-09-13 ENCOUNTER — Encounter: Payer: Self-pay | Admitting: Podiatry

## 2020-09-13 ENCOUNTER — Other Ambulatory Visit: Payer: Self-pay

## 2020-09-13 ENCOUNTER — Ambulatory Visit: Payer: Medicare Other | Admitting: Podiatry

## 2020-09-13 DIAGNOSIS — B351 Tinea unguium: Secondary | ICD-10-CM | POA: Diagnosis not present

## 2020-09-13 DIAGNOSIS — M79674 Pain in right toe(s): Secondary | ICD-10-CM

## 2020-09-13 DIAGNOSIS — M79675 Pain in left toe(s): Secondary | ICD-10-CM | POA: Diagnosis not present

## 2020-09-18 NOTE — Progress Notes (Signed)
  Subjective:  Patient ID: Linda Callahan, female    DOB: Feb 05, 1941,  MRN: [id number]  80 y.o. female presents painful thick toenails that are difficult to trim. Pain interferes with ambulation. Aggravating factors include wearing enclosed shoe gear. Pain is relieved with periodic professional debridement.  Allergies  Allergen Reactions  . Glucosamine   . Penicillins   . Vicodin Hp [Hydrocodone-Acetaminophen]     Other reaction(s): nausea and vomiting    Review of Systems: Negative except as noted in the HPI.   Objective:   Constitutional Pt is a pleasant 80 y.o. Caucasian female in NAD. AAO x 3.   Vascular Capillary refill time to digits immediate b/l. Palpable pedal pulses b/l LE. Pedal hair absent. Lower extremity skin temperature gradient within normal limits. No pain with calf compression b/l.  Neurologic Protective sensation intact 5/5 intact bilaterally with 10g monofilament b/l. Vibratory sensation intact b/l.  Dermatologic Pedal skin with normal turgor, texture and tone bilaterally. No open wounds bilaterally. No interdigital macerations bilaterally. Toenails 1-5 b/l elongated, discolored, dystrophic, thickened, crumbly with subungual debris and tenderness to dorsal palpation.  Orthopedic: Normal muscle strength 5/5 to all lower extremity muscle groups bilaterally. No pain crepitus or joint limitation noted with ROM b/l. Hammertoe(s) noted to the 1-5 left. Utilizes cane for ambulation assistance.   Radiographs: None Assessment:   1. Pain due to onychomycosis of toenails of both feet    Plan:  Patient was evaluated and treated and all questions answered.  Onychomycosis with pain -Nails palliatively debridement as below. -Educated on self-care  Procedure: Nail Debridement Rationale: Pain Type of Debridement: manual, sharp debridement. Instrumentation: Nail nipper, rotary burr. Number of Nails: 10  -Examined patient. -Patient to continue soft, supportive shoe gear  daily. -Toenails 1-5 b/l were debrided in length and girth with sterile nail nippers and dremel without iatrogenic bleeding.  -Patient to report any pedal injuries to medical professional immediately. -Patient/POA to call should there be question/concern in the interim.  Return in about 3 months (around 12/14/2020).  Freddie Breech, DPM

## 2020-10-20 DIAGNOSIS — M170 Bilateral primary osteoarthritis of knee: Secondary | ICD-10-CM | POA: Diagnosis not present

## 2020-12-13 ENCOUNTER — Other Ambulatory Visit: Payer: Self-pay

## 2020-12-13 ENCOUNTER — Encounter: Payer: Self-pay | Admitting: Podiatry

## 2020-12-13 ENCOUNTER — Ambulatory Visit: Payer: Medicare Other | Admitting: Podiatry

## 2020-12-13 DIAGNOSIS — B351 Tinea unguium: Secondary | ICD-10-CM

## 2020-12-13 DIAGNOSIS — M79674 Pain in right toe(s): Secondary | ICD-10-CM | POA: Diagnosis not present

## 2020-12-13 DIAGNOSIS — M79675 Pain in left toe(s): Secondary | ICD-10-CM | POA: Diagnosis not present

## 2020-12-13 NOTE — Progress Notes (Signed)
Subjective: Linda Callahan is a pleasant 80 y.o. female patient seen today painful thick toenails that are difficult to trim. Pain interferes with ambulation. Aggravating factors include wearing enclosed shoe gear. Pain is relieved with periodic professional debridement.  PCP is Lupita Raider, MD. Last visit was: 08/03/2020.  She voices no new pedal problems on today's visit.  Allergies  Allergen Reactions   Glucosamine     Other reaction(s): itch   Hydrocodone-Acetaminophen     Other reaction(s): nausea and vomiting Other reaction(s): nausea and vomiting   Penicillins     Other reaction(s): Unknown   Sulfa Antibiotics     Other reaction(s): Unknown    Objective: Physical Exam  General: Linda Callahan is a pleasant 80 y.o. Caucasian female, WD, WN in NAD. AAO x 3.   Vascular:  Capillary refill time to digits immediate b/l. Palpable DP pulse(s) b/l lower extremities Palpable PT pulse(s) b/l lower extremities Pedal hair absent. Lower extremity skin temperature gradient within normal limits. No pain with calf compression b/l. No edema noted b/l lower extremities.  Dermatological:  Skin warm and supple b/l lower extremities. No open wounds b/l lower extremities. No interdigital macerations b/l lower extremities. Toenails 1-5 b/l elongated, discolored, dystrophic, thickened, crumbly with subungual debris and tenderness to dorsal palpation.  Musculoskeletal:  Normal muscle strength 5/5 to all lower extremity muscle groups bilaterally. Hammertoe(s) noted to the 1-5 bilaterally.  Neurological:  Protective sensation intact 5/5 intact bilaterally with 10g monofilament b/l. Vibratory sensation intact b/l.  Assessment and Plan:  1. Pain due to onychomycosis of toenails of both feet   -Examined patient. -Patient to continue soft, supportive shoe gear daily. -Toenails 1-5 b/l were debrided in length and girth with sterile nail nippers and dremel without iatrogenic bleeding.  -Patient to  report any pedal injuries to medical professional immediately. -Patient/POA to call should there be question/concern in the interim.  Return in about 3 months (around 03/15/2021).  Freddie Breech, DPM

## 2021-02-11 DIAGNOSIS — Z23 Encounter for immunization: Secondary | ICD-10-CM | POA: Diagnosis not present

## 2021-03-21 ENCOUNTER — Ambulatory Visit: Payer: Medicare Other | Admitting: Podiatry

## 2021-03-21 ENCOUNTER — Other Ambulatory Visit: Payer: Self-pay

## 2021-03-21 ENCOUNTER — Encounter: Payer: Self-pay | Admitting: Podiatry

## 2021-03-21 DIAGNOSIS — M79675 Pain in left toe(s): Secondary | ICD-10-CM | POA: Diagnosis not present

## 2021-03-21 DIAGNOSIS — B351 Tinea unguium: Secondary | ICD-10-CM | POA: Diagnosis not present

## 2021-03-21 DIAGNOSIS — M79674 Pain in right toe(s): Secondary | ICD-10-CM

## 2021-03-25 ENCOUNTER — Encounter: Payer: Self-pay | Admitting: Podiatry

## 2021-03-25 NOTE — Progress Notes (Signed)
  Subjective:  Patient ID: Linda Callahan, female    DOB: 1941-04-30,  MRN: [id number]  Linda Callahan presents to clinic today for painful elongated mycotic toenails 1-5 bilaterally which are tender when wearing enclosed shoe gear. Pain is relieved with periodic professional debridement.  She notes no new pedal problems on today's visit.  PCP is Lupita Raider, MD , and last visit was 08/02/2020.  Allergies  Allergen Reactions   Glucosamine     Other reaction(s): itch   Hydrocodone-Acetaminophen     Other reaction(s): nausea and vomiting Other reaction(s): nausea and vomiting   Penicillins     Other reaction(s): Unknown   Sulfa Antibiotics     Other reaction(s): Unknown    Review of Systems: Negative except as noted in the HPI. Objective:   Constitutional Linda Callahan is a pleasant 80 y.o. Caucasian female, WD, WN in NAD. AAO x 3.   Vascular CFT immediate b/l LE. Palpable DP/PT pulses b/l LE. Digital hair absent b/l. Skin temperature gradient WNL b/l. No pain with calf compression b/l. No edema noted b/l. No cyanosis or clubbing noted b/l LE.  Neurologic Normal speech. Oriented to person, place, and time. Protective sensation intact 5/5 intact bilaterally with 10g monofilament b/l. Vibratory sensation intact b/l. Proprioception intact bilaterally.  Dermatologic Pedal integument with normal turgor, texture and tone b/l LE. No open wounds b/l. No interdigital macerations b/l. Toenails 1-5 b/l elongated, thickened, discolored with subungual debris. +Tenderness with dorsal palpation of nailplates. No hyperkeratotic or porokeratotic lesions present.  Orthopedic: Normal muscle strength 5/5 to all lower extremity muscle groups bilaterally. Hammertoe deformity noted 1-5 b/l.Marland Kitchen No pain, crepitus or joint limitation noted with ROM b/l LE.  Patient ambulates independently without assistive aids.   Radiographs: None   Assessment:   1. Pain due to onychomycosis of toenails of both feet     Plan:  Patient was evaluated and treated and all questions answered. Consent given for treatment as described below: -No new findings. No new orders. -Patient to continue soft, supportive shoe gear daily. -Mycotic toenails 1-5 bilaterally were debrided in length and girth with sterile nail nippers and dremel without incident. -Patient/POA to call should there be question/concern in the interim.  Return in about 3 months (around 06/21/2021).  Freddie Breech, DPM

## 2021-06-30 ENCOUNTER — Ambulatory Visit: Payer: Medicare Other | Admitting: Podiatry

## 2021-06-30 ENCOUNTER — Other Ambulatory Visit: Payer: Self-pay

## 2021-06-30 ENCOUNTER — Encounter: Payer: Self-pay | Admitting: Podiatry

## 2021-06-30 DIAGNOSIS — M79674 Pain in right toe(s): Secondary | ICD-10-CM

## 2021-06-30 DIAGNOSIS — B351 Tinea unguium: Secondary | ICD-10-CM

## 2021-06-30 DIAGNOSIS — M79675 Pain in left toe(s): Secondary | ICD-10-CM | POA: Diagnosis not present

## 2021-07-04 DIAGNOSIS — M170 Bilateral primary osteoarthritis of knee: Secondary | ICD-10-CM | POA: Diagnosis not present

## 2021-07-09 NOTE — Progress Notes (Signed)
?  Subjective:  ?Patient ID: ELFRIEDE BONINI, female    DOB: 1941-01-20,  MRN: [id number] ? ?[place] Semrad Whiteaker presents to clinic today for painful thick toenails that are difficult to trim. Pain interferes with ambulation. Aggravating factors include wearing enclosed shoe gear. Pain is relieved with periodic professional debridement. ? ?New problem(s): None.  ? ?PCP is Lupita Raider, MD , and last visit was February 11, 2021. ? ?Allergies  ?Allergen Reactions  ? Glucosamine   ?  Other reaction(s): itch  ? Hydrocodone-Acetaminophen   ?  Other reaction(s): nausea and vomiting ?Other reaction(s): nausea and vomiting  ? Penicillins   ?  Other reaction(s): Unknown  ? Sulfa Antibiotics   ?  Other reaction(s): Unknown  ? ? ?Review of Systems: Negative except as noted in the HPI. ? ?Objective: ?Physical Exam ? ?General: POSEY JASMIN is a pleasant 81 y.o. Caucasian female, WD, WN in NAD. AAO x 3.  ? ?Vascular:  ?Capillary refill time to digits immediate b/l. Palpable DP pulse(s) b/l lower extremities Palpable PT pulse(s) b/l lower extremities Pedal hair absent. Lower extremity skin temperature gradient within normal limits. No pain with calf compression b/l. No edema noted b/l lower extremities. ? ?Dermatological:  ?Skin warm and supple b/l lower extremities. No open wounds b/l lower extremities. No interdigital macerations b/l lower extremities. Toenails 1-5 b/l elongated, discolored, dystrophic, thickened, crumbly with subungual debris and tenderness to dorsal palpation. ? ?Musculoskeletal:  ?Normal muscle strength 5/5 to all lower extremity muscle groups bilaterally. Hammertoe(s) noted to the 1-5 bilaterally. ? ?Neurological:  ?Protective sensation intact 5/5 intact bilaterally with 10g monofilament b/l. Vibratory sensation intact b/l. ? ?Assessment/Plan: ?1. Pain due to onychomycosis of toenails of both feet   ?-Examined patient. ?-Toenails 1-5 b/l were debrided in length and girth with sterile nail nippers and dremel  without iatrogenic bleeding.  ?-Patient/POA to call should there be question/concern in the interim.  ? ?Return in about 3 months (around 09/30/2021). ? ?Freddie Breech, DPM  ?

## 2021-08-16 DIAGNOSIS — M1990 Unspecified osteoarthritis, unspecified site: Secondary | ICD-10-CM | POA: Diagnosis not present

## 2021-08-16 DIAGNOSIS — E041 Nontoxic single thyroid nodule: Secondary | ICD-10-CM | POA: Diagnosis not present

## 2021-08-16 DIAGNOSIS — E7800 Pure hypercholesterolemia, unspecified: Secondary | ICD-10-CM | POA: Diagnosis not present

## 2021-08-16 DIAGNOSIS — I10 Essential (primary) hypertension: Secondary | ICD-10-CM | POA: Diagnosis not present

## 2021-08-16 DIAGNOSIS — R7301 Impaired fasting glucose: Secondary | ICD-10-CM | POA: Diagnosis not present

## 2021-08-16 DIAGNOSIS — E039 Hypothyroidism, unspecified: Secondary | ICD-10-CM | POA: Diagnosis not present

## 2021-08-16 DIAGNOSIS — Z0000 Encounter for general adult medical examination without abnormal findings: Secondary | ICD-10-CM | POA: Diagnosis not present

## 2021-08-17 ENCOUNTER — Other Ambulatory Visit: Payer: Self-pay | Admitting: Family Medicine

## 2021-08-17 DIAGNOSIS — E041 Nontoxic single thyroid nodule: Secondary | ICD-10-CM

## 2021-10-03 ENCOUNTER — Ambulatory Visit: Payer: Medicare Other | Admitting: Podiatry

## 2021-10-03 ENCOUNTER — Encounter: Payer: Self-pay | Admitting: Podiatry

## 2021-10-03 DIAGNOSIS — M79675 Pain in left toe(s): Secondary | ICD-10-CM

## 2021-10-03 DIAGNOSIS — B351 Tinea unguium: Secondary | ICD-10-CM

## 2021-10-03 DIAGNOSIS — M79674 Pain in right toe(s): Secondary | ICD-10-CM

## 2021-10-08 NOTE — Progress Notes (Addendum)
  Subjective:  Patient ID: Linda Callahan, female    DOB: 12-03-1940,  MRN: [id number]  Linda Callahan presents to clinic today for painful elongated mycotic toenails 1-5 bilaterally which are tender when wearing enclosed shoe gear. Pain is relieved with periodic professional debridement.  New problem(s): None.   PCP is Lupita Raider, MD , and last visit was August 16, 2021.  Allergies  Allergen Reactions   Glucosamine     Other reaction(s): itch   Hydrocodone-Acetaminophen     Other reaction(s): nausea and vomiting Other reaction(s): nausea and vomiting   Penicillins     Other reaction(s): Unknown Other reaction(s): Unknown   Sulfa Antibiotics     Other reaction(s): Unknown    Review of Systems: Negative except as noted in the HPI.  Objective: No changes noted in today's physical examination. General: Linda Callahan is a pleasant 81 y.o. Caucasian female, WD, WN in NAD. AAO x 3.   Vascular:  Capillary refill time to digits immediate b/l. Palpable DP pulse(s) b/l lower extremities Palpable PT pulse(s) b/l lower extremities Pedal hair absent. Lower extremity skin temperature gradient within normal limits. No pain with calf compression b/l. No edema noted b/l lower extremities.  Dermatological:  Skin warm and supple b/l lower extremities. No open wounds b/l lower extremities. No interdigital macerations b/l lower extremities. Toenails 1-5 b/l elongated, discolored, dystrophic, thickened, crumbly with subungual debris and tenderness to dorsal palpation.  Musculoskeletal:  Normal muscle strength 5/5 to all lower extremity muscle groups bilaterally. Hammertoe(s) noted to the 1-5 bilaterally.  Neurological:  Protective sensation intact 5/5 intact bilaterally with 10g monofilament b/l. Vibratory sensation intact b/l.  Assessment/Plan: 1. Pain due to onychomycosis of toenails of both feet     -Examined patient. -No new findings. No new orders. -Toenails 1-5 b/l were debrided in  length and girth with sterile nail nippers and dremel without iatrogenic bleeding.  -Patient/POA to call should there be question/concern in the interim.   Return in about 3 months (around 01/03/2022).  Freddie Breech, DPM

## 2022-01-05 DIAGNOSIS — R270 Ataxia, unspecified: Secondary | ICD-10-CM | POA: Diagnosis not present

## 2022-01-05 DIAGNOSIS — E039 Hypothyroidism, unspecified: Secondary | ICD-10-CM | POA: Diagnosis not present

## 2022-01-05 DIAGNOSIS — Z23 Encounter for immunization: Secondary | ICD-10-CM | POA: Diagnosis not present

## 2022-01-05 DIAGNOSIS — R7301 Impaired fasting glucose: Secondary | ICD-10-CM | POA: Diagnosis not present

## 2022-01-05 DIAGNOSIS — M1990 Unspecified osteoarthritis, unspecified site: Secondary | ICD-10-CM | POA: Diagnosis not present

## 2022-01-12 ENCOUNTER — Ambulatory Visit: Payer: Medicare Other | Admitting: Podiatry

## 2022-01-17 ENCOUNTER — Ambulatory Visit: Payer: Medicare Other | Admitting: Podiatry

## 2022-01-18 DIAGNOSIS — R270 Ataxia, unspecified: Secondary | ICD-10-CM | POA: Diagnosis not present

## 2022-01-18 DIAGNOSIS — R7303 Prediabetes: Secondary | ICD-10-CM | POA: Diagnosis not present

## 2022-01-18 DIAGNOSIS — Z9181 History of falling: Secondary | ICD-10-CM | POA: Diagnosis not present

## 2022-01-18 DIAGNOSIS — M159 Polyosteoarthritis, unspecified: Secondary | ICD-10-CM | POA: Diagnosis not present

## 2022-01-18 DIAGNOSIS — M1909 Primary osteoarthritis, other specified site: Secondary | ICD-10-CM | POA: Diagnosis not present

## 2022-01-18 DIAGNOSIS — I10 Essential (primary) hypertension: Secondary | ICD-10-CM | POA: Diagnosis not present

## 2022-01-22 DIAGNOSIS — I10 Essential (primary) hypertension: Secondary | ICD-10-CM | POA: Diagnosis not present

## 2022-01-22 DIAGNOSIS — R270 Ataxia, unspecified: Secondary | ICD-10-CM | POA: Diagnosis not present

## 2022-01-22 DIAGNOSIS — R7303 Prediabetes: Secondary | ICD-10-CM | POA: Diagnosis not present

## 2022-01-22 DIAGNOSIS — Z9181 History of falling: Secondary | ICD-10-CM | POA: Diagnosis not present

## 2022-01-22 DIAGNOSIS — M1909 Primary osteoarthritis, other specified site: Secondary | ICD-10-CM | POA: Diagnosis not present

## 2022-01-22 DIAGNOSIS — M159 Polyosteoarthritis, unspecified: Secondary | ICD-10-CM | POA: Diagnosis not present

## 2022-01-26 DIAGNOSIS — Z9181 History of falling: Secondary | ICD-10-CM | POA: Diagnosis not present

## 2022-01-26 DIAGNOSIS — R7303 Prediabetes: Secondary | ICD-10-CM | POA: Diagnosis not present

## 2022-01-26 DIAGNOSIS — M1909 Primary osteoarthritis, other specified site: Secondary | ICD-10-CM | POA: Diagnosis not present

## 2022-01-26 DIAGNOSIS — R270 Ataxia, unspecified: Secondary | ICD-10-CM | POA: Diagnosis not present

## 2022-01-26 DIAGNOSIS — I10 Essential (primary) hypertension: Secondary | ICD-10-CM | POA: Diagnosis not present

## 2022-01-26 DIAGNOSIS — M159 Polyosteoarthritis, unspecified: Secondary | ICD-10-CM | POA: Diagnosis not present

## 2022-02-01 DIAGNOSIS — R270 Ataxia, unspecified: Secondary | ICD-10-CM | POA: Diagnosis not present

## 2022-02-01 DIAGNOSIS — Z9181 History of falling: Secondary | ICD-10-CM | POA: Diagnosis not present

## 2022-02-01 DIAGNOSIS — I10 Essential (primary) hypertension: Secondary | ICD-10-CM | POA: Diagnosis not present

## 2022-02-01 DIAGNOSIS — M159 Polyosteoarthritis, unspecified: Secondary | ICD-10-CM | POA: Diagnosis not present

## 2022-02-01 DIAGNOSIS — R7303 Prediabetes: Secondary | ICD-10-CM | POA: Diagnosis not present

## 2022-02-01 DIAGNOSIS — M1909 Primary osteoarthritis, other specified site: Secondary | ICD-10-CM | POA: Diagnosis not present

## 2022-02-02 DIAGNOSIS — R270 Ataxia, unspecified: Secondary | ICD-10-CM | POA: Diagnosis not present

## 2022-02-02 DIAGNOSIS — M159 Polyosteoarthritis, unspecified: Secondary | ICD-10-CM | POA: Diagnosis not present

## 2022-02-02 DIAGNOSIS — Z9181 History of falling: Secondary | ICD-10-CM | POA: Diagnosis not present

## 2022-02-02 DIAGNOSIS — I10 Essential (primary) hypertension: Secondary | ICD-10-CM | POA: Diagnosis not present

## 2022-02-02 DIAGNOSIS — R7303 Prediabetes: Secondary | ICD-10-CM | POA: Diagnosis not present

## 2022-02-02 DIAGNOSIS — M1909 Primary osteoarthritis, other specified site: Secondary | ICD-10-CM | POA: Diagnosis not present

## 2022-02-07 DIAGNOSIS — R270 Ataxia, unspecified: Secondary | ICD-10-CM | POA: Diagnosis not present

## 2022-02-07 DIAGNOSIS — M1909 Primary osteoarthritis, other specified site: Secondary | ICD-10-CM | POA: Diagnosis not present

## 2022-02-07 DIAGNOSIS — I10 Essential (primary) hypertension: Secondary | ICD-10-CM | POA: Diagnosis not present

## 2022-02-07 DIAGNOSIS — Z9181 History of falling: Secondary | ICD-10-CM | POA: Diagnosis not present

## 2022-02-07 DIAGNOSIS — R7303 Prediabetes: Secondary | ICD-10-CM | POA: Diagnosis not present

## 2022-02-07 DIAGNOSIS — M159 Polyosteoarthritis, unspecified: Secondary | ICD-10-CM | POA: Diagnosis not present

## 2022-02-08 DIAGNOSIS — Z9181 History of falling: Secondary | ICD-10-CM | POA: Diagnosis not present

## 2022-02-08 DIAGNOSIS — M159 Polyosteoarthritis, unspecified: Secondary | ICD-10-CM | POA: Diagnosis not present

## 2022-02-08 DIAGNOSIS — R7303 Prediabetes: Secondary | ICD-10-CM | POA: Diagnosis not present

## 2022-02-08 DIAGNOSIS — R270 Ataxia, unspecified: Secondary | ICD-10-CM | POA: Diagnosis not present

## 2022-02-08 DIAGNOSIS — M1909 Primary osteoarthritis, other specified site: Secondary | ICD-10-CM | POA: Diagnosis not present

## 2022-02-08 DIAGNOSIS — I10 Essential (primary) hypertension: Secondary | ICD-10-CM | POA: Diagnosis not present

## 2022-02-12 DIAGNOSIS — R270 Ataxia, unspecified: Secondary | ICD-10-CM | POA: Diagnosis not present

## 2022-02-12 DIAGNOSIS — I10 Essential (primary) hypertension: Secondary | ICD-10-CM | POA: Diagnosis not present

## 2022-02-12 DIAGNOSIS — M1909 Primary osteoarthritis, other specified site: Secondary | ICD-10-CM | POA: Diagnosis not present

## 2022-02-12 DIAGNOSIS — Z9181 History of falling: Secondary | ICD-10-CM | POA: Diagnosis not present

## 2022-02-12 DIAGNOSIS — R7303 Prediabetes: Secondary | ICD-10-CM | POA: Diagnosis not present

## 2022-02-12 DIAGNOSIS — M159 Polyosteoarthritis, unspecified: Secondary | ICD-10-CM | POA: Diagnosis not present

## 2022-02-13 DIAGNOSIS — I10 Essential (primary) hypertension: Secondary | ICD-10-CM | POA: Diagnosis not present

## 2022-02-13 DIAGNOSIS — R7303 Prediabetes: Secondary | ICD-10-CM | POA: Diagnosis not present

## 2022-02-13 DIAGNOSIS — Z9181 History of falling: Secondary | ICD-10-CM | POA: Diagnosis not present

## 2022-02-13 DIAGNOSIS — M1909 Primary osteoarthritis, other specified site: Secondary | ICD-10-CM | POA: Diagnosis not present

## 2022-02-13 DIAGNOSIS — R270 Ataxia, unspecified: Secondary | ICD-10-CM | POA: Diagnosis not present

## 2022-02-13 DIAGNOSIS — M159 Polyosteoarthritis, unspecified: Secondary | ICD-10-CM | POA: Diagnosis not present

## 2022-02-15 ENCOUNTER — Ambulatory Visit: Payer: Medicare Other | Admitting: Podiatry

## 2022-02-16 DIAGNOSIS — Z9181 History of falling: Secondary | ICD-10-CM | POA: Diagnosis not present

## 2022-02-16 DIAGNOSIS — I10 Essential (primary) hypertension: Secondary | ICD-10-CM | POA: Diagnosis not present

## 2022-02-16 DIAGNOSIS — M159 Polyosteoarthritis, unspecified: Secondary | ICD-10-CM | POA: Diagnosis not present

## 2022-02-16 DIAGNOSIS — M1909 Primary osteoarthritis, other specified site: Secondary | ICD-10-CM | POA: Diagnosis not present

## 2022-02-16 DIAGNOSIS — R270 Ataxia, unspecified: Secondary | ICD-10-CM | POA: Diagnosis not present

## 2022-02-16 DIAGNOSIS — R7303 Prediabetes: Secondary | ICD-10-CM | POA: Diagnosis not present

## 2022-02-19 DIAGNOSIS — R7303 Prediabetes: Secondary | ICD-10-CM | POA: Diagnosis not present

## 2022-02-19 DIAGNOSIS — Z9181 History of falling: Secondary | ICD-10-CM | POA: Diagnosis not present

## 2022-02-19 DIAGNOSIS — M1909 Primary osteoarthritis, other specified site: Secondary | ICD-10-CM | POA: Diagnosis not present

## 2022-02-19 DIAGNOSIS — M159 Polyosteoarthritis, unspecified: Secondary | ICD-10-CM | POA: Diagnosis not present

## 2022-02-19 DIAGNOSIS — R270 Ataxia, unspecified: Secondary | ICD-10-CM | POA: Diagnosis not present

## 2022-02-19 DIAGNOSIS — I10 Essential (primary) hypertension: Secondary | ICD-10-CM | POA: Diagnosis not present

## 2022-02-20 DIAGNOSIS — M1990 Unspecified osteoarthritis, unspecified site: Secondary | ICD-10-CM | POA: Diagnosis not present

## 2022-02-20 DIAGNOSIS — L8990 Pressure ulcer of unspecified site, unspecified stage: Secondary | ICD-10-CM | POA: Diagnosis not present

## 2022-02-21 DIAGNOSIS — Z9181 History of falling: Secondary | ICD-10-CM | POA: Diagnosis not present

## 2022-02-21 DIAGNOSIS — R270 Ataxia, unspecified: Secondary | ICD-10-CM | POA: Diagnosis not present

## 2022-02-21 DIAGNOSIS — I10 Essential (primary) hypertension: Secondary | ICD-10-CM | POA: Diagnosis not present

## 2022-02-21 DIAGNOSIS — R7303 Prediabetes: Secondary | ICD-10-CM | POA: Diagnosis not present

## 2022-02-21 DIAGNOSIS — M1909 Primary osteoarthritis, other specified site: Secondary | ICD-10-CM | POA: Diagnosis not present

## 2022-02-21 DIAGNOSIS — M159 Polyosteoarthritis, unspecified: Secondary | ICD-10-CM | POA: Diagnosis not present

## 2022-02-22 DIAGNOSIS — M1909 Primary osteoarthritis, other specified site: Secondary | ICD-10-CM | POA: Diagnosis not present

## 2022-02-22 DIAGNOSIS — M159 Polyosteoarthritis, unspecified: Secondary | ICD-10-CM | POA: Diagnosis not present

## 2022-02-22 DIAGNOSIS — R7303 Prediabetes: Secondary | ICD-10-CM | POA: Diagnosis not present

## 2022-02-22 DIAGNOSIS — R270 Ataxia, unspecified: Secondary | ICD-10-CM | POA: Diagnosis not present

## 2022-02-22 DIAGNOSIS — Z9181 History of falling: Secondary | ICD-10-CM | POA: Diagnosis not present

## 2022-02-22 DIAGNOSIS — I10 Essential (primary) hypertension: Secondary | ICD-10-CM | POA: Diagnosis not present

## 2022-02-23 DIAGNOSIS — Z9181 History of falling: Secondary | ICD-10-CM | POA: Diagnosis not present

## 2022-02-23 DIAGNOSIS — I10 Essential (primary) hypertension: Secondary | ICD-10-CM | POA: Diagnosis not present

## 2022-02-23 DIAGNOSIS — R270 Ataxia, unspecified: Secondary | ICD-10-CM | POA: Diagnosis not present

## 2022-02-23 DIAGNOSIS — R7303 Prediabetes: Secondary | ICD-10-CM | POA: Diagnosis not present

## 2022-02-23 DIAGNOSIS — M1909 Primary osteoarthritis, other specified site: Secondary | ICD-10-CM | POA: Diagnosis not present

## 2022-02-23 DIAGNOSIS — M159 Polyosteoarthritis, unspecified: Secondary | ICD-10-CM | POA: Diagnosis not present

## 2022-02-28 DIAGNOSIS — I10 Essential (primary) hypertension: Secondary | ICD-10-CM | POA: Diagnosis not present

## 2022-02-28 DIAGNOSIS — R7303 Prediabetes: Secondary | ICD-10-CM | POA: Diagnosis not present

## 2022-02-28 DIAGNOSIS — M159 Polyosteoarthritis, unspecified: Secondary | ICD-10-CM | POA: Diagnosis not present

## 2022-02-28 DIAGNOSIS — Z9181 History of falling: Secondary | ICD-10-CM | POA: Diagnosis not present

## 2022-02-28 DIAGNOSIS — M1909 Primary osteoarthritis, other specified site: Secondary | ICD-10-CM | POA: Diagnosis not present

## 2022-02-28 DIAGNOSIS — R270 Ataxia, unspecified: Secondary | ICD-10-CM | POA: Diagnosis not present

## 2022-03-01 DIAGNOSIS — Z9181 History of falling: Secondary | ICD-10-CM | POA: Diagnosis not present

## 2022-03-01 DIAGNOSIS — R7303 Prediabetes: Secondary | ICD-10-CM | POA: Diagnosis not present

## 2022-03-01 DIAGNOSIS — R270 Ataxia, unspecified: Secondary | ICD-10-CM | POA: Diagnosis not present

## 2022-03-01 DIAGNOSIS — M159 Polyosteoarthritis, unspecified: Secondary | ICD-10-CM | POA: Diagnosis not present

## 2022-03-01 DIAGNOSIS — M1909 Primary osteoarthritis, other specified site: Secondary | ICD-10-CM | POA: Diagnosis not present

## 2022-03-01 DIAGNOSIS — I10 Essential (primary) hypertension: Secondary | ICD-10-CM | POA: Diagnosis not present

## 2022-03-02 DIAGNOSIS — I10 Essential (primary) hypertension: Secondary | ICD-10-CM | POA: Diagnosis not present

## 2022-03-02 DIAGNOSIS — M1909 Primary osteoarthritis, other specified site: Secondary | ICD-10-CM | POA: Diagnosis not present

## 2022-03-02 DIAGNOSIS — R270 Ataxia, unspecified: Secondary | ICD-10-CM | POA: Diagnosis not present

## 2022-03-02 DIAGNOSIS — M159 Polyosteoarthritis, unspecified: Secondary | ICD-10-CM | POA: Diagnosis not present

## 2022-03-02 DIAGNOSIS — Z9181 History of falling: Secondary | ICD-10-CM | POA: Diagnosis not present

## 2022-03-02 DIAGNOSIS — R7303 Prediabetes: Secondary | ICD-10-CM | POA: Diagnosis not present

## 2022-03-07 DIAGNOSIS — M159 Polyosteoarthritis, unspecified: Secondary | ICD-10-CM | POA: Diagnosis not present

## 2022-03-07 DIAGNOSIS — I10 Essential (primary) hypertension: Secondary | ICD-10-CM | POA: Diagnosis not present

## 2022-03-07 DIAGNOSIS — R270 Ataxia, unspecified: Secondary | ICD-10-CM | POA: Diagnosis not present

## 2022-03-07 DIAGNOSIS — R7303 Prediabetes: Secondary | ICD-10-CM | POA: Diagnosis not present

## 2022-03-07 DIAGNOSIS — Z9181 History of falling: Secondary | ICD-10-CM | POA: Diagnosis not present

## 2022-03-07 DIAGNOSIS — M1909 Primary osteoarthritis, other specified site: Secondary | ICD-10-CM | POA: Diagnosis not present

## 2022-03-13 DIAGNOSIS — I10 Essential (primary) hypertension: Secondary | ICD-10-CM | POA: Diagnosis not present

## 2022-03-13 DIAGNOSIS — R7303 Prediabetes: Secondary | ICD-10-CM | POA: Diagnosis not present

## 2022-03-13 DIAGNOSIS — M1909 Primary osteoarthritis, other specified site: Secondary | ICD-10-CM | POA: Diagnosis not present

## 2022-03-13 DIAGNOSIS — Z9181 History of falling: Secondary | ICD-10-CM | POA: Diagnosis not present

## 2022-03-13 DIAGNOSIS — R270 Ataxia, unspecified: Secondary | ICD-10-CM | POA: Diagnosis not present

## 2022-03-13 DIAGNOSIS — M159 Polyosteoarthritis, unspecified: Secondary | ICD-10-CM | POA: Diagnosis not present

## 2022-03-16 DIAGNOSIS — M159 Polyosteoarthritis, unspecified: Secondary | ICD-10-CM | POA: Diagnosis not present

## 2022-03-16 DIAGNOSIS — I10 Essential (primary) hypertension: Secondary | ICD-10-CM | POA: Diagnosis not present

## 2022-03-16 DIAGNOSIS — R7303 Prediabetes: Secondary | ICD-10-CM | POA: Diagnosis not present

## 2022-03-16 DIAGNOSIS — M1909 Primary osteoarthritis, other specified site: Secondary | ICD-10-CM | POA: Diagnosis not present

## 2022-03-16 DIAGNOSIS — Z9181 History of falling: Secondary | ICD-10-CM | POA: Diagnosis not present

## 2022-03-16 DIAGNOSIS — R270 Ataxia, unspecified: Secondary | ICD-10-CM | POA: Diagnosis not present

## 2022-03-20 ENCOUNTER — Ambulatory Visit: Payer: Medicare Other | Admitting: Podiatry

## 2022-04-02 DIAGNOSIS — R270 Ataxia, unspecified: Secondary | ICD-10-CM | POA: Diagnosis not present

## 2022-04-02 DIAGNOSIS — E039 Hypothyroidism, unspecified: Secondary | ICD-10-CM | POA: Diagnosis not present

## 2022-04-02 DIAGNOSIS — E7800 Pure hypercholesterolemia, unspecified: Secondary | ICD-10-CM | POA: Diagnosis not present

## 2022-04-02 DIAGNOSIS — M160 Bilateral primary osteoarthritis of hip: Secondary | ICD-10-CM | POA: Diagnosis not present

## 2022-04-02 DIAGNOSIS — M159 Polyosteoarthritis, unspecified: Secondary | ICD-10-CM | POA: Diagnosis not present

## 2022-04-02 DIAGNOSIS — E781 Pure hyperglyceridemia: Secondary | ICD-10-CM | POA: Diagnosis not present

## 2022-04-02 DIAGNOSIS — J301 Allergic rhinitis due to pollen: Secondary | ICD-10-CM | POA: Diagnosis not present

## 2022-04-02 DIAGNOSIS — M170 Bilateral primary osteoarthritis of knee: Secondary | ICD-10-CM | POA: Diagnosis not present

## 2022-04-02 DIAGNOSIS — I10 Essential (primary) hypertension: Secondary | ICD-10-CM | POA: Diagnosis not present

## 2022-04-02 DIAGNOSIS — E049 Nontoxic goiter, unspecified: Secondary | ICD-10-CM | POA: Diagnosis not present

## 2022-04-02 DIAGNOSIS — Z9181 History of falling: Secondary | ICD-10-CM | POA: Diagnosis not present

## 2022-04-02 DIAGNOSIS — R7301 Impaired fasting glucose: Secondary | ICD-10-CM | POA: Diagnosis not present

## 2022-04-06 DIAGNOSIS — R270 Ataxia, unspecified: Secondary | ICD-10-CM | POA: Diagnosis not present

## 2022-04-06 DIAGNOSIS — E049 Nontoxic goiter, unspecified: Secondary | ICD-10-CM | POA: Diagnosis not present

## 2022-04-06 DIAGNOSIS — M170 Bilateral primary osteoarthritis of knee: Secondary | ICD-10-CM | POA: Diagnosis not present

## 2022-04-06 DIAGNOSIS — E7800 Pure hypercholesterolemia, unspecified: Secondary | ICD-10-CM | POA: Diagnosis not present

## 2022-04-06 DIAGNOSIS — J301 Allergic rhinitis due to pollen: Secondary | ICD-10-CM | POA: Diagnosis not present

## 2022-04-06 DIAGNOSIS — M160 Bilateral primary osteoarthritis of hip: Secondary | ICD-10-CM | POA: Diagnosis not present

## 2022-04-06 DIAGNOSIS — M159 Polyosteoarthritis, unspecified: Secondary | ICD-10-CM | POA: Diagnosis not present

## 2022-04-06 DIAGNOSIS — Z9181 History of falling: Secondary | ICD-10-CM | POA: Diagnosis not present

## 2022-04-06 DIAGNOSIS — E039 Hypothyroidism, unspecified: Secondary | ICD-10-CM | POA: Diagnosis not present

## 2022-04-06 DIAGNOSIS — R7301 Impaired fasting glucose: Secondary | ICD-10-CM | POA: Diagnosis not present

## 2022-04-06 DIAGNOSIS — I10 Essential (primary) hypertension: Secondary | ICD-10-CM | POA: Diagnosis not present

## 2022-04-13 DIAGNOSIS — M170 Bilateral primary osteoarthritis of knee: Secondary | ICD-10-CM | POA: Diagnosis not present

## 2022-04-13 DIAGNOSIS — J301 Allergic rhinitis due to pollen: Secondary | ICD-10-CM | POA: Diagnosis not present

## 2022-04-13 DIAGNOSIS — M160 Bilateral primary osteoarthritis of hip: Secondary | ICD-10-CM | POA: Diagnosis not present

## 2022-04-13 DIAGNOSIS — E7800 Pure hypercholesterolemia, unspecified: Secondary | ICD-10-CM | POA: Diagnosis not present

## 2022-04-13 DIAGNOSIS — R7301 Impaired fasting glucose: Secondary | ICD-10-CM | POA: Diagnosis not present

## 2022-04-13 DIAGNOSIS — I10 Essential (primary) hypertension: Secondary | ICD-10-CM | POA: Diagnosis not present

## 2022-04-13 DIAGNOSIS — Z9181 History of falling: Secondary | ICD-10-CM | POA: Diagnosis not present

## 2022-04-13 DIAGNOSIS — M159 Polyosteoarthritis, unspecified: Secondary | ICD-10-CM | POA: Diagnosis not present

## 2022-04-13 DIAGNOSIS — E039 Hypothyroidism, unspecified: Secondary | ICD-10-CM | POA: Diagnosis not present

## 2022-04-13 DIAGNOSIS — E049 Nontoxic goiter, unspecified: Secondary | ICD-10-CM | POA: Diagnosis not present

## 2022-04-13 DIAGNOSIS — R270 Ataxia, unspecified: Secondary | ICD-10-CM | POA: Diagnosis not present

## 2022-04-18 DIAGNOSIS — J301 Allergic rhinitis due to pollen: Secondary | ICD-10-CM | POA: Diagnosis not present

## 2022-04-18 DIAGNOSIS — E039 Hypothyroidism, unspecified: Secondary | ICD-10-CM | POA: Diagnosis not present

## 2022-04-18 DIAGNOSIS — R270 Ataxia, unspecified: Secondary | ICD-10-CM | POA: Diagnosis not present

## 2022-04-18 DIAGNOSIS — M170 Bilateral primary osteoarthritis of knee: Secondary | ICD-10-CM | POA: Diagnosis not present

## 2022-04-18 DIAGNOSIS — Z9181 History of falling: Secondary | ICD-10-CM | POA: Diagnosis not present

## 2022-04-18 DIAGNOSIS — R7301 Impaired fasting glucose: Secondary | ICD-10-CM | POA: Diagnosis not present

## 2022-04-18 DIAGNOSIS — M159 Polyosteoarthritis, unspecified: Secondary | ICD-10-CM | POA: Diagnosis not present

## 2022-04-18 DIAGNOSIS — M160 Bilateral primary osteoarthritis of hip: Secondary | ICD-10-CM | POA: Diagnosis not present

## 2022-04-18 DIAGNOSIS — I10 Essential (primary) hypertension: Secondary | ICD-10-CM | POA: Diagnosis not present

## 2022-04-18 DIAGNOSIS — E049 Nontoxic goiter, unspecified: Secondary | ICD-10-CM | POA: Diagnosis not present

## 2022-04-18 DIAGNOSIS — E7800 Pure hypercholesterolemia, unspecified: Secondary | ICD-10-CM | POA: Diagnosis not present

## 2022-04-19 DIAGNOSIS — E7800 Pure hypercholesterolemia, unspecified: Secondary | ICD-10-CM | POA: Diagnosis not present

## 2022-04-19 DIAGNOSIS — R270 Ataxia, unspecified: Secondary | ICD-10-CM | POA: Diagnosis not present

## 2022-04-19 DIAGNOSIS — M170 Bilateral primary osteoarthritis of knee: Secondary | ICD-10-CM | POA: Diagnosis not present

## 2022-04-19 DIAGNOSIS — E039 Hypothyroidism, unspecified: Secondary | ICD-10-CM | POA: Diagnosis not present

## 2022-04-19 DIAGNOSIS — I10 Essential (primary) hypertension: Secondary | ICD-10-CM | POA: Diagnosis not present

## 2022-04-19 DIAGNOSIS — M159 Polyosteoarthritis, unspecified: Secondary | ICD-10-CM | POA: Diagnosis not present

## 2022-04-19 DIAGNOSIS — E049 Nontoxic goiter, unspecified: Secondary | ICD-10-CM | POA: Diagnosis not present

## 2022-04-19 DIAGNOSIS — Z9181 History of falling: Secondary | ICD-10-CM | POA: Diagnosis not present

## 2022-04-19 DIAGNOSIS — J301 Allergic rhinitis due to pollen: Secondary | ICD-10-CM | POA: Diagnosis not present

## 2022-04-19 DIAGNOSIS — R7301 Impaired fasting glucose: Secondary | ICD-10-CM | POA: Diagnosis not present

## 2022-04-19 DIAGNOSIS — M160 Bilateral primary osteoarthritis of hip: Secondary | ICD-10-CM | POA: Diagnosis not present

## 2022-04-20 DIAGNOSIS — M1990 Unspecified osteoarthritis, unspecified site: Secondary | ICD-10-CM | POA: Diagnosis not present

## 2022-05-01 DIAGNOSIS — R270 Ataxia, unspecified: Secondary | ICD-10-CM | POA: Diagnosis not present

## 2022-05-01 DIAGNOSIS — E039 Hypothyroidism, unspecified: Secondary | ICD-10-CM | POA: Diagnosis not present

## 2022-05-01 DIAGNOSIS — E7800 Pure hypercholesterolemia, unspecified: Secondary | ICD-10-CM | POA: Diagnosis not present

## 2022-05-01 DIAGNOSIS — Z9181 History of falling: Secondary | ICD-10-CM | POA: Diagnosis not present

## 2022-05-01 DIAGNOSIS — M160 Bilateral primary osteoarthritis of hip: Secondary | ICD-10-CM | POA: Diagnosis not present

## 2022-05-01 DIAGNOSIS — M170 Bilateral primary osteoarthritis of knee: Secondary | ICD-10-CM | POA: Diagnosis not present

## 2022-05-01 DIAGNOSIS — M159 Polyosteoarthritis, unspecified: Secondary | ICD-10-CM | POA: Diagnosis not present

## 2022-05-01 DIAGNOSIS — E049 Nontoxic goiter, unspecified: Secondary | ICD-10-CM | POA: Diagnosis not present

## 2022-05-01 DIAGNOSIS — I10 Essential (primary) hypertension: Secondary | ICD-10-CM | POA: Diagnosis not present

## 2022-05-01 DIAGNOSIS — R7301 Impaired fasting glucose: Secondary | ICD-10-CM | POA: Diagnosis not present

## 2022-05-01 DIAGNOSIS — J301 Allergic rhinitis due to pollen: Secondary | ICD-10-CM | POA: Diagnosis not present

## 2022-05-02 DIAGNOSIS — M160 Bilateral primary osteoarthritis of hip: Secondary | ICD-10-CM | POA: Diagnosis not present

## 2022-05-02 DIAGNOSIS — M159 Polyosteoarthritis, unspecified: Secondary | ICD-10-CM | POA: Diagnosis not present

## 2022-05-02 DIAGNOSIS — R7301 Impaired fasting glucose: Secondary | ICD-10-CM | POA: Diagnosis not present

## 2022-05-02 DIAGNOSIS — J301 Allergic rhinitis due to pollen: Secondary | ICD-10-CM | POA: Diagnosis not present

## 2022-05-02 DIAGNOSIS — E039 Hypothyroidism, unspecified: Secondary | ICD-10-CM | POA: Diagnosis not present

## 2022-05-02 DIAGNOSIS — Z9181 History of falling: Secondary | ICD-10-CM | POA: Diagnosis not present

## 2022-05-02 DIAGNOSIS — E049 Nontoxic goiter, unspecified: Secondary | ICD-10-CM | POA: Diagnosis not present

## 2022-05-02 DIAGNOSIS — R270 Ataxia, unspecified: Secondary | ICD-10-CM | POA: Diagnosis not present

## 2022-05-02 DIAGNOSIS — I10 Essential (primary) hypertension: Secondary | ICD-10-CM | POA: Diagnosis not present

## 2022-05-02 DIAGNOSIS — M170 Bilateral primary osteoarthritis of knee: Secondary | ICD-10-CM | POA: Diagnosis not present

## 2022-05-02 DIAGNOSIS — E7800 Pure hypercholesterolemia, unspecified: Secondary | ICD-10-CM | POA: Diagnosis not present

## 2022-05-04 ENCOUNTER — Telehealth: Payer: Self-pay | Admitting: *Deleted

## 2022-05-08 ENCOUNTER — Ambulatory Visit: Payer: Medicare Other | Admitting: Podiatry

## 2022-05-10 NOTE — Telephone Encounter (Signed)
error 

## 2022-05-14 DIAGNOSIS — M159 Polyosteoarthritis, unspecified: Secondary | ICD-10-CM | POA: Diagnosis not present

## 2022-05-14 DIAGNOSIS — Z9181 History of falling: Secondary | ICD-10-CM | POA: Diagnosis not present

## 2022-05-14 DIAGNOSIS — M160 Bilateral primary osteoarthritis of hip: Secondary | ICD-10-CM | POA: Diagnosis not present

## 2022-05-14 DIAGNOSIS — M170 Bilateral primary osteoarthritis of knee: Secondary | ICD-10-CM | POA: Diagnosis not present

## 2022-05-14 DIAGNOSIS — R7301 Impaired fasting glucose: Secondary | ICD-10-CM | POA: Diagnosis not present

## 2022-05-14 DIAGNOSIS — J301 Allergic rhinitis due to pollen: Secondary | ICD-10-CM | POA: Diagnosis not present

## 2022-05-14 DIAGNOSIS — R270 Ataxia, unspecified: Secondary | ICD-10-CM | POA: Diagnosis not present

## 2022-05-14 DIAGNOSIS — I10 Essential (primary) hypertension: Secondary | ICD-10-CM | POA: Diagnosis not present

## 2022-05-14 DIAGNOSIS — E7800 Pure hypercholesterolemia, unspecified: Secondary | ICD-10-CM | POA: Diagnosis not present

## 2022-05-14 DIAGNOSIS — E049 Nontoxic goiter, unspecified: Secondary | ICD-10-CM | POA: Diagnosis not present

## 2022-05-14 DIAGNOSIS — E039 Hypothyroidism, unspecified: Secondary | ICD-10-CM | POA: Diagnosis not present

## 2022-05-17 DIAGNOSIS — E7800 Pure hypercholesterolemia, unspecified: Secondary | ICD-10-CM | POA: Diagnosis not present

## 2022-05-17 DIAGNOSIS — J301 Allergic rhinitis due to pollen: Secondary | ICD-10-CM | POA: Diagnosis not present

## 2022-05-17 DIAGNOSIS — R7301 Impaired fasting glucose: Secondary | ICD-10-CM | POA: Diagnosis not present

## 2022-05-17 DIAGNOSIS — Z9181 History of falling: Secondary | ICD-10-CM | POA: Diagnosis not present

## 2022-05-17 DIAGNOSIS — M160 Bilateral primary osteoarthritis of hip: Secondary | ICD-10-CM | POA: Diagnosis not present

## 2022-05-17 DIAGNOSIS — M159 Polyosteoarthritis, unspecified: Secondary | ICD-10-CM | POA: Diagnosis not present

## 2022-05-17 DIAGNOSIS — R270 Ataxia, unspecified: Secondary | ICD-10-CM | POA: Diagnosis not present

## 2022-05-17 DIAGNOSIS — E049 Nontoxic goiter, unspecified: Secondary | ICD-10-CM | POA: Diagnosis not present

## 2022-05-17 DIAGNOSIS — M170 Bilateral primary osteoarthritis of knee: Secondary | ICD-10-CM | POA: Diagnosis not present

## 2022-05-17 DIAGNOSIS — I10 Essential (primary) hypertension: Secondary | ICD-10-CM | POA: Diagnosis not present

## 2022-05-17 DIAGNOSIS — E039 Hypothyroidism, unspecified: Secondary | ICD-10-CM | POA: Diagnosis not present

## 2022-05-24 DIAGNOSIS — M159 Polyosteoarthritis, unspecified: Secondary | ICD-10-CM | POA: Diagnosis not present

## 2022-05-24 DIAGNOSIS — E7800 Pure hypercholesterolemia, unspecified: Secondary | ICD-10-CM | POA: Diagnosis not present

## 2022-05-24 DIAGNOSIS — E039 Hypothyroidism, unspecified: Secondary | ICD-10-CM | POA: Diagnosis not present

## 2022-05-24 DIAGNOSIS — Z9181 History of falling: Secondary | ICD-10-CM | POA: Diagnosis not present

## 2022-05-24 DIAGNOSIS — M160 Bilateral primary osteoarthritis of hip: Secondary | ICD-10-CM | POA: Diagnosis not present

## 2022-05-24 DIAGNOSIS — E049 Nontoxic goiter, unspecified: Secondary | ICD-10-CM | POA: Diagnosis not present

## 2022-05-24 DIAGNOSIS — M170 Bilateral primary osteoarthritis of knee: Secondary | ICD-10-CM | POA: Diagnosis not present

## 2022-05-24 DIAGNOSIS — R7301 Impaired fasting glucose: Secondary | ICD-10-CM | POA: Diagnosis not present

## 2022-05-24 DIAGNOSIS — R270 Ataxia, unspecified: Secondary | ICD-10-CM | POA: Diagnosis not present

## 2022-05-24 DIAGNOSIS — J301 Allergic rhinitis due to pollen: Secondary | ICD-10-CM | POA: Diagnosis not present

## 2022-05-24 DIAGNOSIS — I10 Essential (primary) hypertension: Secondary | ICD-10-CM | POA: Diagnosis not present

## 2022-05-30 DIAGNOSIS — M170 Bilateral primary osteoarthritis of knee: Secondary | ICD-10-CM | POA: Diagnosis not present

## 2022-05-30 DIAGNOSIS — E049 Nontoxic goiter, unspecified: Secondary | ICD-10-CM | POA: Diagnosis not present

## 2022-05-30 DIAGNOSIS — R270 Ataxia, unspecified: Secondary | ICD-10-CM | POA: Diagnosis not present

## 2022-05-30 DIAGNOSIS — E039 Hypothyroidism, unspecified: Secondary | ICD-10-CM | POA: Diagnosis not present

## 2022-05-30 DIAGNOSIS — I10 Essential (primary) hypertension: Secondary | ICD-10-CM | POA: Diagnosis not present

## 2022-05-30 DIAGNOSIS — Z9181 History of falling: Secondary | ICD-10-CM | POA: Diagnosis not present

## 2022-05-30 DIAGNOSIS — R7301 Impaired fasting glucose: Secondary | ICD-10-CM | POA: Diagnosis not present

## 2022-05-30 DIAGNOSIS — J301 Allergic rhinitis due to pollen: Secondary | ICD-10-CM | POA: Diagnosis not present

## 2022-05-30 DIAGNOSIS — M160 Bilateral primary osteoarthritis of hip: Secondary | ICD-10-CM | POA: Diagnosis not present

## 2022-05-30 DIAGNOSIS — M159 Polyosteoarthritis, unspecified: Secondary | ICD-10-CM | POA: Diagnosis not present

## 2022-05-30 DIAGNOSIS — E7800 Pure hypercholesterolemia, unspecified: Secondary | ICD-10-CM | POA: Diagnosis not present

## 2022-06-03 DIAGNOSIS — I214 Non-ST elevation (NSTEMI) myocardial infarction: Secondary | ICD-10-CM | POA: Diagnosis not present

## 2022-06-03 DIAGNOSIS — D72829 Elevated white blood cell count, unspecified: Secondary | ICD-10-CM | POA: Diagnosis not present

## 2022-06-03 DIAGNOSIS — I2609 Other pulmonary embolism with acute cor pulmonale: Secondary | ICD-10-CM | POA: Diagnosis not present

## 2022-06-03 DIAGNOSIS — R7401 Elevation of levels of liver transaminase levels: Secondary | ICD-10-CM | POA: Diagnosis not present

## 2022-06-03 DIAGNOSIS — I5043 Acute on chronic combined systolic (congestive) and diastolic (congestive) heart failure: Secondary | ICD-10-CM | POA: Diagnosis not present

## 2022-06-03 DIAGNOSIS — E872 Acidosis, unspecified: Secondary | ICD-10-CM | POA: Diagnosis not present

## 2022-06-03 DIAGNOSIS — I82431 Acute embolism and thrombosis of right popliteal vein: Secondary | ICD-10-CM | POA: Diagnosis not present

## 2022-06-03 DIAGNOSIS — J90 Pleural effusion, not elsewhere classified: Secondary | ICD-10-CM | POA: Diagnosis not present

## 2022-06-03 DIAGNOSIS — E559 Vitamin D deficiency, unspecified: Secondary | ICD-10-CM | POA: Diagnosis not present

## 2022-06-03 DIAGNOSIS — N179 Acute kidney failure, unspecified: Secondary | ICD-10-CM | POA: Diagnosis not present

## 2022-06-03 DIAGNOSIS — K761 Chronic passive congestion of liver: Secondary | ICD-10-CM | POA: Diagnosis not present

## 2022-06-03 DIAGNOSIS — I50810 Right heart failure, unspecified: Secondary | ICD-10-CM | POA: Diagnosis not present

## 2022-06-03 DIAGNOSIS — R000 Tachycardia, unspecified: Secondary | ICD-10-CM | POA: Diagnosis not present

## 2022-06-03 DIAGNOSIS — I2694 Multiple subsegmental pulmonary emboli without acute cor pulmonale: Secondary | ICD-10-CM | POA: Diagnosis not present

## 2022-06-03 DIAGNOSIS — J9601 Acute respiratory failure with hypoxia: Secondary | ICD-10-CM | POA: Diagnosis not present

## 2022-06-03 DIAGNOSIS — N178 Other acute kidney failure: Secondary | ICD-10-CM | POA: Diagnosis not present

## 2022-06-03 DIAGNOSIS — R0689 Other abnormalities of breathing: Secondary | ICD-10-CM | POA: Diagnosis not present

## 2022-06-03 DIAGNOSIS — I468 Cardiac arrest due to other underlying condition: Secondary | ICD-10-CM | POA: Diagnosis not present

## 2022-06-03 DIAGNOSIS — I2489 Other forms of acute ischemic heart disease: Secondary | ICD-10-CM | POA: Diagnosis not present

## 2022-06-03 DIAGNOSIS — I2699 Other pulmonary embolism without acute cor pulmonale: Secondary | ICD-10-CM | POA: Diagnosis not present

## 2022-06-03 DIAGNOSIS — L89312 Pressure ulcer of right buttock, stage 2: Secondary | ICD-10-CM | POA: Diagnosis not present

## 2022-06-03 DIAGNOSIS — I361 Nonrheumatic tricuspid (valve) insufficiency: Secondary | ICD-10-CM | POA: Diagnosis not present

## 2022-06-03 DIAGNOSIS — I5023 Acute on chronic systolic (congestive) heart failure: Secondary | ICD-10-CM | POA: Diagnosis not present

## 2022-06-03 DIAGNOSIS — D509 Iron deficiency anemia, unspecified: Secondary | ICD-10-CM | POA: Diagnosis not present

## 2022-06-03 DIAGNOSIS — I071 Rheumatic tricuspid insufficiency: Secondary | ICD-10-CM | POA: Diagnosis not present

## 2022-06-03 DIAGNOSIS — R0602 Shortness of breath: Secondary | ICD-10-CM | POA: Diagnosis not present

## 2022-06-03 DIAGNOSIS — I50811 Acute right heart failure: Secondary | ICD-10-CM | POA: Diagnosis not present

## 2022-06-03 DIAGNOSIS — T380X5A Adverse effect of glucocorticoids and synthetic analogues, initial encounter: Secondary | ICD-10-CM | POA: Diagnosis not present

## 2022-06-03 DIAGNOSIS — I5020 Unspecified systolic (congestive) heart failure: Secondary | ICD-10-CM | POA: Diagnosis not present

## 2022-06-03 DIAGNOSIS — E039 Hypothyroidism, unspecified: Secondary | ICD-10-CM | POA: Diagnosis not present

## 2022-06-03 DIAGNOSIS — N170 Acute kidney failure with tubular necrosis: Secondary | ICD-10-CM | POA: Diagnosis not present

## 2022-06-03 DIAGNOSIS — D649 Anemia, unspecified: Secondary | ICD-10-CM | POA: Diagnosis not present

## 2022-06-03 DIAGNOSIS — E8721 Acute metabolic acidosis: Secondary | ICD-10-CM | POA: Diagnosis not present

## 2022-06-03 DIAGNOSIS — I272 Pulmonary hypertension, unspecified: Secondary | ICD-10-CM | POA: Diagnosis not present

## 2022-06-03 DIAGNOSIS — Z20822 Contact with and (suspected) exposure to covid-19: Secondary | ICD-10-CM | POA: Diagnosis not present

## 2022-06-03 DIAGNOSIS — D500 Iron deficiency anemia secondary to blood loss (chronic): Secondary | ICD-10-CM | POA: Diagnosis not present

## 2022-06-03 DIAGNOSIS — Z66 Do not resuscitate: Secondary | ICD-10-CM | POA: Diagnosis not present

## 2022-06-03 DIAGNOSIS — I82441 Acute embolism and thrombosis of right tibial vein: Secondary | ICD-10-CM | POA: Diagnosis not present

## 2022-06-03 DIAGNOSIS — I82461 Acute embolism and thrombosis of right calf muscular vein: Secondary | ICD-10-CM | POA: Diagnosis not present

## 2022-06-03 DIAGNOSIS — E785 Hyperlipidemia, unspecified: Secondary | ICD-10-CM | POA: Diagnosis not present

## 2022-06-03 DIAGNOSIS — R0902 Hypoxemia: Secondary | ICD-10-CM | POA: Diagnosis not present

## 2022-06-03 DIAGNOSIS — E860 Dehydration: Secondary | ICD-10-CM | POA: Diagnosis not present

## 2022-06-03 DIAGNOSIS — I959 Hypotension, unspecified: Secondary | ICD-10-CM | POA: Diagnosis not present

## 2022-06-03 DIAGNOSIS — M170 Bilateral primary osteoarthritis of knee: Secondary | ICD-10-CM | POA: Diagnosis not present

## 2022-06-03 DIAGNOSIS — I10 Essential (primary) hypertension: Secondary | ICD-10-CM | POA: Diagnosis not present

## 2022-06-03 DIAGNOSIS — I4510 Unspecified right bundle-branch block: Secondary | ICD-10-CM | POA: Diagnosis not present

## 2022-06-03 DIAGNOSIS — I517 Cardiomegaly: Secondary | ICD-10-CM | POA: Diagnosis not present

## 2022-06-03 DIAGNOSIS — A419 Sepsis, unspecified organism: Secondary | ICD-10-CM | POA: Diagnosis not present

## 2022-06-03 DIAGNOSIS — Z9981 Dependence on supplemental oxygen: Secondary | ICD-10-CM | POA: Diagnosis not present

## 2022-06-03 DIAGNOSIS — I4719 Other supraventricular tachycardia: Secondary | ICD-10-CM | POA: Diagnosis not present

## 2022-06-03 DIAGNOSIS — R570 Cardiogenic shock: Secondary | ICD-10-CM | POA: Diagnosis not present

## 2022-06-03 DIAGNOSIS — I472 Ventricular tachycardia, unspecified: Secondary | ICD-10-CM | POA: Diagnosis not present

## 2022-06-03 DIAGNOSIS — Z743 Need for continuous supervision: Secondary | ICD-10-CM | POA: Diagnosis not present

## 2022-06-03 DIAGNOSIS — I5030 Unspecified diastolic (congestive) heart failure: Secondary | ICD-10-CM | POA: Diagnosis not present

## 2022-06-03 DIAGNOSIS — I878 Other specified disorders of veins: Secondary | ICD-10-CM | POA: Diagnosis not present

## 2022-06-03 DIAGNOSIS — R7989 Other specified abnormal findings of blood chemistry: Secondary | ICD-10-CM | POA: Diagnosis not present

## 2022-06-03 DIAGNOSIS — I4729 Other ventricular tachycardia: Secondary | ICD-10-CM | POA: Diagnosis not present

## 2022-06-03 DIAGNOSIS — R410 Disorientation, unspecified: Secondary | ICD-10-CM | POA: Diagnosis not present

## 2022-06-03 DIAGNOSIS — L89152 Pressure ulcer of sacral region, stage 2: Secondary | ICD-10-CM | POA: Diagnosis not present

## 2022-06-03 DIAGNOSIS — Z7901 Long term (current) use of anticoagulants: Secondary | ICD-10-CM | POA: Diagnosis not present

## 2022-06-03 DIAGNOSIS — D62 Acute posthemorrhagic anemia: Secondary | ICD-10-CM | POA: Diagnosis not present

## 2022-06-03 DIAGNOSIS — I110 Hypertensive heart disease with heart failure: Secondary | ICD-10-CM | POA: Diagnosis not present

## 2022-06-03 DIAGNOSIS — K449 Diaphragmatic hernia without obstruction or gangrene: Secondary | ICD-10-CM | POA: Diagnosis not present

## 2022-06-03 DIAGNOSIS — I491 Atrial premature depolarization: Secondary | ICD-10-CM | POA: Diagnosis not present

## 2022-06-03 DIAGNOSIS — R531 Weakness: Secondary | ICD-10-CM | POA: Diagnosis not present

## 2022-06-13 DIAGNOSIS — D509 Iron deficiency anemia, unspecified: Secondary | ICD-10-CM | POA: Diagnosis not present

## 2022-06-13 DIAGNOSIS — K0889 Other specified disorders of teeth and supporting structures: Secondary | ICD-10-CM | POA: Diagnosis not present

## 2022-06-13 DIAGNOSIS — Z79899 Other long term (current) drug therapy: Secondary | ICD-10-CM | POA: Diagnosis not present

## 2022-06-13 DIAGNOSIS — Z7901 Long term (current) use of anticoagulants: Secondary | ICD-10-CM | POA: Diagnosis not present

## 2022-06-13 DIAGNOSIS — K219 Gastro-esophageal reflux disease without esophagitis: Secondary | ICD-10-CM | POA: Diagnosis not present

## 2022-06-13 DIAGNOSIS — K649 Unspecified hemorrhoids: Secondary | ICD-10-CM | POA: Diagnosis not present

## 2022-06-13 DIAGNOSIS — R531 Weakness: Secondary | ICD-10-CM | POA: Diagnosis not present

## 2022-06-13 DIAGNOSIS — N179 Acute kidney failure, unspecified: Secondary | ICD-10-CM | POA: Diagnosis not present

## 2022-06-13 DIAGNOSIS — N182 Chronic kidney disease, stage 2 (mild): Secondary | ICD-10-CM | POA: Diagnosis not present

## 2022-06-13 DIAGNOSIS — D72829 Elevated white blood cell count, unspecified: Secondary | ICD-10-CM | POA: Diagnosis not present

## 2022-06-13 DIAGNOSIS — R7401 Elevation of levels of liver transaminase levels: Secondary | ICD-10-CM | POA: Diagnosis not present

## 2022-06-13 DIAGNOSIS — E872 Acidosis, unspecified: Secondary | ICD-10-CM | POA: Diagnosis not present

## 2022-06-13 DIAGNOSIS — I2699 Other pulmonary embolism without acute cor pulmonale: Secondary | ICD-10-CM | POA: Diagnosis not present

## 2022-06-13 DIAGNOSIS — M170 Bilateral primary osteoarthritis of knee: Secondary | ICD-10-CM | POA: Diagnosis not present

## 2022-06-13 DIAGNOSIS — K5900 Constipation, unspecified: Secondary | ICD-10-CM | POA: Diagnosis not present

## 2022-06-13 DIAGNOSIS — R109 Unspecified abdominal pain: Secondary | ICD-10-CM | POA: Diagnosis not present

## 2022-06-13 DIAGNOSIS — I129 Hypertensive chronic kidney disease with stage 1 through stage 4 chronic kidney disease, or unspecified chronic kidney disease: Secondary | ICD-10-CM | POA: Diagnosis not present

## 2022-06-13 DIAGNOSIS — J9601 Acute respiratory failure with hypoxia: Secondary | ICD-10-CM | POA: Diagnosis not present

## 2022-06-13 DIAGNOSIS — L89152 Pressure ulcer of sacral region, stage 2: Secondary | ICD-10-CM | POA: Diagnosis not present

## 2022-06-13 DIAGNOSIS — I130 Hypertensive heart and chronic kidney disease with heart failure and stage 1 through stage 4 chronic kidney disease, or unspecified chronic kidney disease: Secondary | ICD-10-CM | POA: Diagnosis not present

## 2022-06-13 DIAGNOSIS — I5030 Unspecified diastolic (congestive) heart failure: Secondary | ICD-10-CM | POA: Diagnosis not present

## 2022-06-13 DIAGNOSIS — D649 Anemia, unspecified: Secondary | ICD-10-CM | POA: Diagnosis not present

## 2022-06-13 DIAGNOSIS — M6281 Muscle weakness (generalized): Secondary | ICD-10-CM | POA: Diagnosis not present

## 2022-06-13 DIAGNOSIS — J811 Chronic pulmonary edema: Secondary | ICD-10-CM | POA: Diagnosis not present

## 2022-06-13 DIAGNOSIS — I110 Hypertensive heart disease with heart failure: Secondary | ICD-10-CM | POA: Diagnosis not present

## 2022-06-13 DIAGNOSIS — K449 Diaphragmatic hernia without obstruction or gangrene: Secondary | ICD-10-CM | POA: Diagnosis not present

## 2022-06-13 DIAGNOSIS — G3184 Mild cognitive impairment, so stated: Secondary | ICD-10-CM | POA: Diagnosis not present

## 2022-06-13 DIAGNOSIS — E039 Hypothyroidism, unspecified: Secondary | ICD-10-CM | POA: Diagnosis not present

## 2022-06-13 DIAGNOSIS — I82401 Acute embolism and thrombosis of unspecified deep veins of right lower extremity: Secondary | ICD-10-CM | POA: Diagnosis not present

## 2022-06-13 DIAGNOSIS — F32A Depression, unspecified: Secondary | ICD-10-CM | POA: Diagnosis not present

## 2022-06-13 DIAGNOSIS — E119 Type 2 diabetes mellitus without complications: Secondary | ICD-10-CM | POA: Diagnosis not present

## 2022-06-13 DIAGNOSIS — I5023 Acute on chronic systolic (congestive) heart failure: Secondary | ICD-10-CM | POA: Diagnosis not present

## 2022-06-13 DIAGNOSIS — I10 Essential (primary) hypertension: Secondary | ICD-10-CM | POA: Diagnosis not present

## 2022-06-13 DIAGNOSIS — E559 Vitamin D deficiency, unspecified: Secondary | ICD-10-CM | POA: Diagnosis not present

## 2022-06-13 DIAGNOSIS — Z743 Need for continuous supervision: Secondary | ICD-10-CM | POA: Diagnosis not present

## 2022-06-15 DIAGNOSIS — K0889 Other specified disorders of teeth and supporting structures: Secondary | ICD-10-CM | POA: Diagnosis not present

## 2022-06-15 DIAGNOSIS — I110 Hypertensive heart disease with heart failure: Secondary | ICD-10-CM | POA: Diagnosis not present

## 2022-06-15 DIAGNOSIS — R7401 Elevation of levels of liver transaminase levels: Secondary | ICD-10-CM | POA: Diagnosis not present

## 2022-06-15 DIAGNOSIS — M170 Bilateral primary osteoarthritis of knee: Secondary | ICD-10-CM | POA: Diagnosis not present

## 2022-06-15 DIAGNOSIS — L89152 Pressure ulcer of sacral region, stage 2: Secondary | ICD-10-CM | POA: Diagnosis not present

## 2022-06-15 DIAGNOSIS — K219 Gastro-esophageal reflux disease without esophagitis: Secondary | ICD-10-CM | POA: Diagnosis not present

## 2022-06-15 DIAGNOSIS — M6281 Muscle weakness (generalized): Secondary | ICD-10-CM | POA: Diagnosis not present

## 2022-06-15 DIAGNOSIS — K649 Unspecified hemorrhoids: Secondary | ICD-10-CM | POA: Diagnosis not present

## 2022-06-15 DIAGNOSIS — I5030 Unspecified diastolic (congestive) heart failure: Secondary | ICD-10-CM | POA: Diagnosis not present

## 2022-06-15 DIAGNOSIS — I2699 Other pulmonary embolism without acute cor pulmonale: Secondary | ICD-10-CM | POA: Diagnosis not present

## 2022-06-15 DIAGNOSIS — J9601 Acute respiratory failure with hypoxia: Secondary | ICD-10-CM | POA: Diagnosis not present

## 2022-06-15 DIAGNOSIS — K5900 Constipation, unspecified: Secondary | ICD-10-CM | POA: Diagnosis not present

## 2022-07-06 DIAGNOSIS — I110 Hypertensive heart disease with heart failure: Secondary | ICD-10-CM | POA: Diagnosis not present

## 2022-07-06 DIAGNOSIS — I5030 Unspecified diastolic (congestive) heart failure: Secondary | ICD-10-CM | POA: Diagnosis not present

## 2022-07-06 DIAGNOSIS — D509 Iron deficiency anemia, unspecified: Secondary | ICD-10-CM | POA: Diagnosis not present

## 2022-07-06 DIAGNOSIS — I2699 Other pulmonary embolism without acute cor pulmonale: Secondary | ICD-10-CM | POA: Diagnosis not present

## 2022-07-09 DIAGNOSIS — K5900 Constipation, unspecified: Secondary | ICD-10-CM | POA: Diagnosis not present

## 2022-07-09 DIAGNOSIS — I130 Hypertensive heart and chronic kidney disease with heart failure and stage 1 through stage 4 chronic kidney disease, or unspecified chronic kidney disease: Secondary | ICD-10-CM | POA: Diagnosis not present

## 2022-07-09 DIAGNOSIS — M170 Bilateral primary osteoarthritis of knee: Secondary | ICD-10-CM | POA: Diagnosis not present

## 2022-07-09 DIAGNOSIS — K219 Gastro-esophageal reflux disease without esophagitis: Secondary | ICD-10-CM | POA: Diagnosis not present

## 2022-07-09 DIAGNOSIS — E559 Vitamin D deficiency, unspecified: Secondary | ICD-10-CM | POA: Diagnosis not present

## 2022-07-09 DIAGNOSIS — I5030 Unspecified diastolic (congestive) heart failure: Secondary | ICD-10-CM | POA: Diagnosis not present

## 2022-07-09 DIAGNOSIS — D509 Iron deficiency anemia, unspecified: Secondary | ICD-10-CM | POA: Diagnosis not present

## 2022-07-09 DIAGNOSIS — E039 Hypothyroidism, unspecified: Secondary | ICD-10-CM | POA: Diagnosis not present

## 2022-07-09 DIAGNOSIS — I2699 Other pulmonary embolism without acute cor pulmonale: Secondary | ICD-10-CM | POA: Diagnosis not present

## 2022-07-09 DIAGNOSIS — N182 Chronic kidney disease, stage 2 (mild): Secondary | ICD-10-CM | POA: Diagnosis not present

## 2022-07-09 DIAGNOSIS — Z7901 Long term (current) use of anticoagulants: Secondary | ICD-10-CM | POA: Diagnosis not present

## 2022-08-03 DIAGNOSIS — M170 Bilateral primary osteoarthritis of knee: Secondary | ICD-10-CM | POA: Diagnosis not present

## 2022-08-03 DIAGNOSIS — K5900 Constipation, unspecified: Secondary | ICD-10-CM | POA: Diagnosis not present

## 2022-08-03 DIAGNOSIS — I10 Essential (primary) hypertension: Secondary | ICD-10-CM | POA: Diagnosis not present

## 2022-08-04 DIAGNOSIS — R109 Unspecified abdominal pain: Secondary | ICD-10-CM | POA: Diagnosis not present

## 2022-08-06 ENCOUNTER — Ambulatory Visit: Payer: Medicare Other | Admitting: Podiatry

## 2022-08-06 DIAGNOSIS — F32A Depression, unspecified: Secondary | ICD-10-CM | POA: Diagnosis not present

## 2022-08-06 DIAGNOSIS — I5030 Unspecified diastolic (congestive) heart failure: Secondary | ICD-10-CM | POA: Diagnosis not present

## 2022-08-06 DIAGNOSIS — K219 Gastro-esophageal reflux disease without esophagitis: Secondary | ICD-10-CM | POA: Diagnosis not present

## 2022-08-06 DIAGNOSIS — R531 Weakness: Secondary | ICD-10-CM | POA: Diagnosis not present

## 2022-08-06 DIAGNOSIS — L89152 Pressure ulcer of sacral region, stage 2: Secondary | ICD-10-CM | POA: Diagnosis not present

## 2022-08-06 DIAGNOSIS — I2699 Other pulmonary embolism without acute cor pulmonale: Secondary | ICD-10-CM | POA: Diagnosis not present

## 2022-08-06 DIAGNOSIS — I82401 Acute embolism and thrombosis of unspecified deep veins of right lower extremity: Secondary | ICD-10-CM | POA: Diagnosis not present

## 2022-08-06 DIAGNOSIS — N182 Chronic kidney disease, stage 2 (mild): Secondary | ICD-10-CM | POA: Diagnosis not present

## 2022-08-06 DIAGNOSIS — E559 Vitamin D deficiency, unspecified: Secondary | ICD-10-CM | POA: Diagnosis not present

## 2022-08-06 DIAGNOSIS — I130 Hypertensive heart and chronic kidney disease with heart failure and stage 1 through stage 4 chronic kidney disease, or unspecified chronic kidney disease: Secondary | ICD-10-CM | POA: Diagnosis not present

## 2022-08-06 DIAGNOSIS — D509 Iron deficiency anemia, unspecified: Secondary | ICD-10-CM | POA: Diagnosis not present

## 2022-08-07 DIAGNOSIS — G3184 Mild cognitive impairment, so stated: Secondary | ICD-10-CM | POA: Diagnosis not present

## 2022-08-08 DIAGNOSIS — N182 Chronic kidney disease, stage 2 (mild): Secondary | ICD-10-CM | POA: Diagnosis not present

## 2022-08-08 DIAGNOSIS — I129 Hypertensive chronic kidney disease with stage 1 through stage 4 chronic kidney disease, or unspecified chronic kidney disease: Secondary | ICD-10-CM | POA: Diagnosis not present

## 2022-08-08 DIAGNOSIS — D509 Iron deficiency anemia, unspecified: Secondary | ICD-10-CM | POA: Diagnosis not present

## 2022-08-10 DIAGNOSIS — I10 Essential (primary) hypertension: Secondary | ICD-10-CM | POA: Diagnosis not present

## 2022-08-14 DIAGNOSIS — E039 Hypothyroidism, unspecified: Secondary | ICD-10-CM | POA: Diagnosis not present

## 2022-08-14 DIAGNOSIS — Z79899 Other long term (current) drug therapy: Secondary | ICD-10-CM | POA: Diagnosis not present

## 2022-08-31 DIAGNOSIS — N390 Urinary tract infection, site not specified: Secondary | ICD-10-CM | POA: Diagnosis not present

## 2022-08-31 DIAGNOSIS — D509 Iron deficiency anemia, unspecified: Secondary | ICD-10-CM | POA: Diagnosis not present

## 2022-08-31 DIAGNOSIS — K219 Gastro-esophageal reflux disease without esophagitis: Secondary | ICD-10-CM | POA: Diagnosis not present

## 2022-09-03 DIAGNOSIS — I5023 Acute on chronic systolic (congestive) heart failure: Secondary | ICD-10-CM | POA: Diagnosis not present

## 2022-09-03 DIAGNOSIS — I5030 Unspecified diastolic (congestive) heart failure: Secondary | ICD-10-CM | POA: Diagnosis not present

## 2022-09-03 DIAGNOSIS — D509 Iron deficiency anemia, unspecified: Secondary | ICD-10-CM | POA: Diagnosis not present

## 2022-09-03 DIAGNOSIS — Z7901 Long term (current) use of anticoagulants: Secondary | ICD-10-CM | POA: Diagnosis not present

## 2022-09-03 DIAGNOSIS — I10 Essential (primary) hypertension: Secondary | ICD-10-CM | POA: Diagnosis not present

## 2022-09-03 DIAGNOSIS — M6281 Muscle weakness (generalized): Secondary | ICD-10-CM | POA: Diagnosis not present

## 2022-09-03 DIAGNOSIS — N390 Urinary tract infection, site not specified: Secondary | ICD-10-CM | POA: Diagnosis not present

## 2022-09-03 DIAGNOSIS — L89152 Pressure ulcer of sacral region, stage 2: Secondary | ICD-10-CM | POA: Diagnosis not present

## 2022-09-03 DIAGNOSIS — M170 Bilateral primary osteoarthritis of knee: Secondary | ICD-10-CM | POA: Diagnosis not present

## 2022-09-03 DIAGNOSIS — R278 Other lack of coordination: Secondary | ICD-10-CM | POA: Diagnosis not present

## 2022-09-03 DIAGNOSIS — E039 Hypothyroidism, unspecified: Secondary | ICD-10-CM | POA: Diagnosis not present

## 2022-09-03 DIAGNOSIS — E559 Vitamin D deficiency, unspecified: Secondary | ICD-10-CM | POA: Diagnosis not present

## 2022-09-03 DIAGNOSIS — J9601 Acute respiratory failure with hypoxia: Secondary | ICD-10-CM | POA: Diagnosis not present

## 2022-09-03 DIAGNOSIS — I2699 Other pulmonary embolism without acute cor pulmonale: Secondary | ICD-10-CM | POA: Diagnosis not present

## 2022-09-03 DIAGNOSIS — N189 Chronic kidney disease, unspecified: Secondary | ICD-10-CM | POA: Diagnosis not present

## 2022-09-03 DIAGNOSIS — I130 Hypertensive heart and chronic kidney disease with heart failure and stage 1 through stage 4 chronic kidney disease, or unspecified chronic kidney disease: Secondary | ICD-10-CM | POA: Diagnosis not present

## 2022-09-03 DIAGNOSIS — R293 Abnormal posture: Secondary | ICD-10-CM | POA: Diagnosis not present

## 2022-09-05 DIAGNOSIS — I5023 Acute on chronic systolic (congestive) heart failure: Secondary | ICD-10-CM | POA: Diagnosis not present

## 2022-09-05 DIAGNOSIS — E559 Vitamin D deficiency, unspecified: Secondary | ICD-10-CM | POA: Diagnosis not present

## 2022-09-05 DIAGNOSIS — L89152 Pressure ulcer of sacral region, stage 2: Secondary | ICD-10-CM | POA: Diagnosis not present

## 2022-09-05 DIAGNOSIS — M170 Bilateral primary osteoarthritis of knee: Secondary | ICD-10-CM | POA: Diagnosis not present

## 2022-09-05 DIAGNOSIS — I2699 Other pulmonary embolism without acute cor pulmonale: Secondary | ICD-10-CM | POA: Diagnosis not present

## 2022-09-05 DIAGNOSIS — R293 Abnormal posture: Secondary | ICD-10-CM | POA: Diagnosis not present

## 2022-09-05 DIAGNOSIS — I10 Essential (primary) hypertension: Secondary | ICD-10-CM | POA: Diagnosis not present

## 2022-09-05 DIAGNOSIS — D509 Iron deficiency anemia, unspecified: Secondary | ICD-10-CM | POA: Diagnosis not present

## 2022-09-05 DIAGNOSIS — E039 Hypothyroidism, unspecified: Secondary | ICD-10-CM | POA: Diagnosis not present

## 2022-09-05 DIAGNOSIS — R278 Other lack of coordination: Secondary | ICD-10-CM | POA: Diagnosis not present

## 2022-09-05 DIAGNOSIS — M6281 Muscle weakness (generalized): Secondary | ICD-10-CM | POA: Diagnosis not present

## 2022-09-05 DIAGNOSIS — J9601 Acute respiratory failure with hypoxia: Secondary | ICD-10-CM | POA: Diagnosis not present

## 2022-09-06 DIAGNOSIS — I5023 Acute on chronic systolic (congestive) heart failure: Secondary | ICD-10-CM | POA: Diagnosis not present

## 2022-09-06 DIAGNOSIS — E039 Hypothyroidism, unspecified: Secondary | ICD-10-CM | POA: Diagnosis not present

## 2022-09-06 DIAGNOSIS — I2699 Other pulmonary embolism without acute cor pulmonale: Secondary | ICD-10-CM | POA: Diagnosis not present

## 2022-09-06 DIAGNOSIS — M6281 Muscle weakness (generalized): Secondary | ICD-10-CM | POA: Diagnosis not present

## 2022-09-06 DIAGNOSIS — D509 Iron deficiency anemia, unspecified: Secondary | ICD-10-CM | POA: Diagnosis not present

## 2022-09-06 DIAGNOSIS — L89152 Pressure ulcer of sacral region, stage 2: Secondary | ICD-10-CM | POA: Diagnosis not present

## 2022-09-06 DIAGNOSIS — R293 Abnormal posture: Secondary | ICD-10-CM | POA: Diagnosis not present

## 2022-09-06 DIAGNOSIS — J9601 Acute respiratory failure with hypoxia: Secondary | ICD-10-CM | POA: Diagnosis not present

## 2022-09-06 DIAGNOSIS — I10 Essential (primary) hypertension: Secondary | ICD-10-CM | POA: Diagnosis not present

## 2022-09-06 DIAGNOSIS — M170 Bilateral primary osteoarthritis of knee: Secondary | ICD-10-CM | POA: Diagnosis not present

## 2022-09-06 DIAGNOSIS — R278 Other lack of coordination: Secondary | ICD-10-CM | POA: Diagnosis not present

## 2022-09-06 DIAGNOSIS — E559 Vitamin D deficiency, unspecified: Secondary | ICD-10-CM | POA: Diagnosis not present

## 2022-09-07 DIAGNOSIS — I5023 Acute on chronic systolic (congestive) heart failure: Secondary | ICD-10-CM | POA: Diagnosis not present

## 2022-09-07 DIAGNOSIS — R293 Abnormal posture: Secondary | ICD-10-CM | POA: Diagnosis not present

## 2022-09-07 DIAGNOSIS — M6281 Muscle weakness (generalized): Secondary | ICD-10-CM | POA: Diagnosis not present

## 2022-09-07 DIAGNOSIS — E039 Hypothyroidism, unspecified: Secondary | ICD-10-CM | POA: Diagnosis not present

## 2022-09-07 DIAGNOSIS — R278 Other lack of coordination: Secondary | ICD-10-CM | POA: Diagnosis not present

## 2022-09-07 DIAGNOSIS — M170 Bilateral primary osteoarthritis of knee: Secondary | ICD-10-CM | POA: Diagnosis not present

## 2022-09-07 DIAGNOSIS — E559 Vitamin D deficiency, unspecified: Secondary | ICD-10-CM | POA: Diagnosis not present

## 2022-09-07 DIAGNOSIS — D509 Iron deficiency anemia, unspecified: Secondary | ICD-10-CM | POA: Diagnosis not present

## 2022-09-07 DIAGNOSIS — I10 Essential (primary) hypertension: Secondary | ICD-10-CM | POA: Diagnosis not present

## 2022-09-07 DIAGNOSIS — L89152 Pressure ulcer of sacral region, stage 2: Secondary | ICD-10-CM | POA: Diagnosis not present

## 2022-09-07 DIAGNOSIS — I2699 Other pulmonary embolism without acute cor pulmonale: Secondary | ICD-10-CM | POA: Diagnosis not present

## 2022-09-07 DIAGNOSIS — J9601 Acute respiratory failure with hypoxia: Secondary | ICD-10-CM | POA: Diagnosis not present

## 2022-09-10 DIAGNOSIS — M6281 Muscle weakness (generalized): Secondary | ICD-10-CM | POA: Diagnosis not present

## 2022-09-10 DIAGNOSIS — R278 Other lack of coordination: Secondary | ICD-10-CM | POA: Diagnosis not present

## 2022-09-10 DIAGNOSIS — D509 Iron deficiency anemia, unspecified: Secondary | ICD-10-CM | POA: Diagnosis not present

## 2022-09-10 DIAGNOSIS — E039 Hypothyroidism, unspecified: Secondary | ICD-10-CM | POA: Diagnosis not present

## 2022-09-10 DIAGNOSIS — R293 Abnormal posture: Secondary | ICD-10-CM | POA: Diagnosis not present

## 2022-09-10 DIAGNOSIS — M170 Bilateral primary osteoarthritis of knee: Secondary | ICD-10-CM | POA: Diagnosis not present

## 2022-09-10 DIAGNOSIS — J9601 Acute respiratory failure with hypoxia: Secondary | ICD-10-CM | POA: Diagnosis not present

## 2022-09-10 DIAGNOSIS — E559 Vitamin D deficiency, unspecified: Secondary | ICD-10-CM | POA: Diagnosis not present

## 2022-09-10 DIAGNOSIS — I2699 Other pulmonary embolism without acute cor pulmonale: Secondary | ICD-10-CM | POA: Diagnosis not present

## 2022-09-10 DIAGNOSIS — I5023 Acute on chronic systolic (congestive) heart failure: Secondary | ICD-10-CM | POA: Diagnosis not present

## 2022-09-10 DIAGNOSIS — L89152 Pressure ulcer of sacral region, stage 2: Secondary | ICD-10-CM | POA: Diagnosis not present

## 2022-09-10 DIAGNOSIS — I10 Essential (primary) hypertension: Secondary | ICD-10-CM | POA: Diagnosis not present

## 2022-09-11 DIAGNOSIS — I10 Essential (primary) hypertension: Secondary | ICD-10-CM | POA: Diagnosis not present

## 2022-09-11 DIAGNOSIS — M170 Bilateral primary osteoarthritis of knee: Secondary | ICD-10-CM | POA: Diagnosis not present

## 2022-09-11 DIAGNOSIS — E039 Hypothyroidism, unspecified: Secondary | ICD-10-CM | POA: Diagnosis not present

## 2022-09-11 DIAGNOSIS — L89152 Pressure ulcer of sacral region, stage 2: Secondary | ICD-10-CM | POA: Diagnosis not present

## 2022-09-11 DIAGNOSIS — M6281 Muscle weakness (generalized): Secondary | ICD-10-CM | POA: Diagnosis not present

## 2022-09-11 DIAGNOSIS — D509 Iron deficiency anemia, unspecified: Secondary | ICD-10-CM | POA: Diagnosis not present

## 2022-09-11 DIAGNOSIS — I2699 Other pulmonary embolism without acute cor pulmonale: Secondary | ICD-10-CM | POA: Diagnosis not present

## 2022-09-11 DIAGNOSIS — R293 Abnormal posture: Secondary | ICD-10-CM | POA: Diagnosis not present

## 2022-09-11 DIAGNOSIS — J9601 Acute respiratory failure with hypoxia: Secondary | ICD-10-CM | POA: Diagnosis not present

## 2022-09-11 DIAGNOSIS — E559 Vitamin D deficiency, unspecified: Secondary | ICD-10-CM | POA: Diagnosis not present

## 2022-09-11 DIAGNOSIS — R278 Other lack of coordination: Secondary | ICD-10-CM | POA: Diagnosis not present

## 2022-09-11 DIAGNOSIS — I5023 Acute on chronic systolic (congestive) heart failure: Secondary | ICD-10-CM | POA: Diagnosis not present

## 2022-09-12 DIAGNOSIS — E039 Hypothyroidism, unspecified: Secondary | ICD-10-CM | POA: Diagnosis not present

## 2022-09-12 DIAGNOSIS — I5030 Unspecified diastolic (congestive) heart failure: Secondary | ICD-10-CM | POA: Diagnosis not present

## 2022-09-12 DIAGNOSIS — I2699 Other pulmonary embolism without acute cor pulmonale: Secondary | ICD-10-CM | POA: Diagnosis not present

## 2022-09-12 DIAGNOSIS — I5023 Acute on chronic systolic (congestive) heart failure: Secondary | ICD-10-CM | POA: Diagnosis not present

## 2022-09-12 DIAGNOSIS — L89152 Pressure ulcer of sacral region, stage 2: Secondary | ICD-10-CM | POA: Diagnosis not present

## 2022-09-12 DIAGNOSIS — M6281 Muscle weakness (generalized): Secondary | ICD-10-CM | POA: Diagnosis not present

## 2022-09-12 DIAGNOSIS — J9601 Acute respiratory failure with hypoxia: Secondary | ICD-10-CM | POA: Diagnosis not present

## 2022-09-12 DIAGNOSIS — E785 Hyperlipidemia, unspecified: Secondary | ICD-10-CM | POA: Diagnosis not present

## 2022-09-12 DIAGNOSIS — D509 Iron deficiency anemia, unspecified: Secondary | ICD-10-CM | POA: Diagnosis not present

## 2022-09-12 DIAGNOSIS — E559 Vitamin D deficiency, unspecified: Secondary | ICD-10-CM | POA: Diagnosis not present

## 2022-09-12 DIAGNOSIS — I10 Essential (primary) hypertension: Secondary | ICD-10-CM | POA: Diagnosis not present

## 2022-09-12 DIAGNOSIS — R293 Abnormal posture: Secondary | ICD-10-CM | POA: Diagnosis not present

## 2022-09-12 DIAGNOSIS — R278 Other lack of coordination: Secondary | ICD-10-CM | POA: Diagnosis not present

## 2022-09-12 DIAGNOSIS — K219 Gastro-esophageal reflux disease without esophagitis: Secondary | ICD-10-CM | POA: Diagnosis not present

## 2022-09-12 DIAGNOSIS — I82401 Acute embolism and thrombosis of unspecified deep veins of right lower extremity: Secondary | ICD-10-CM | POA: Diagnosis not present

## 2022-09-12 DIAGNOSIS — N189 Chronic kidney disease, unspecified: Secondary | ICD-10-CM | POA: Diagnosis not present

## 2022-09-12 DIAGNOSIS — M170 Bilateral primary osteoarthritis of knee: Secondary | ICD-10-CM | POA: Diagnosis not present

## 2022-09-12 DIAGNOSIS — I130 Hypertensive heart and chronic kidney disease with heart failure and stage 1 through stage 4 chronic kidney disease, or unspecified chronic kidney disease: Secondary | ICD-10-CM | POA: Diagnosis not present

## 2022-09-13 DIAGNOSIS — I5023 Acute on chronic systolic (congestive) heart failure: Secondary | ICD-10-CM | POA: Diagnosis not present

## 2022-09-13 DIAGNOSIS — E559 Vitamin D deficiency, unspecified: Secondary | ICD-10-CM | POA: Diagnosis not present

## 2022-09-13 DIAGNOSIS — M170 Bilateral primary osteoarthritis of knee: Secondary | ICD-10-CM | POA: Diagnosis not present

## 2022-09-13 DIAGNOSIS — R293 Abnormal posture: Secondary | ICD-10-CM | POA: Diagnosis not present

## 2022-09-13 DIAGNOSIS — E039 Hypothyroidism, unspecified: Secondary | ICD-10-CM | POA: Diagnosis not present

## 2022-09-13 DIAGNOSIS — I10 Essential (primary) hypertension: Secondary | ICD-10-CM | POA: Diagnosis not present

## 2022-09-13 DIAGNOSIS — M6281 Muscle weakness (generalized): Secondary | ICD-10-CM | POA: Diagnosis not present

## 2022-09-13 DIAGNOSIS — R278 Other lack of coordination: Secondary | ICD-10-CM | POA: Diagnosis not present

## 2022-09-13 DIAGNOSIS — L89152 Pressure ulcer of sacral region, stage 2: Secondary | ICD-10-CM | POA: Diagnosis not present

## 2022-09-13 DIAGNOSIS — D509 Iron deficiency anemia, unspecified: Secondary | ICD-10-CM | POA: Diagnosis not present

## 2022-09-13 DIAGNOSIS — J9601 Acute respiratory failure with hypoxia: Secondary | ICD-10-CM | POA: Diagnosis not present

## 2022-09-13 DIAGNOSIS — I2699 Other pulmonary embolism without acute cor pulmonale: Secondary | ICD-10-CM | POA: Diagnosis not present

## 2022-09-14 DIAGNOSIS — I2699 Other pulmonary embolism without acute cor pulmonale: Secondary | ICD-10-CM | POA: Diagnosis not present

## 2022-09-14 DIAGNOSIS — R293 Abnormal posture: Secondary | ICD-10-CM | POA: Diagnosis not present

## 2022-09-14 DIAGNOSIS — L89152 Pressure ulcer of sacral region, stage 2: Secondary | ICD-10-CM | POA: Diagnosis not present

## 2022-09-14 DIAGNOSIS — J9601 Acute respiratory failure with hypoxia: Secondary | ICD-10-CM | POA: Diagnosis not present

## 2022-09-14 DIAGNOSIS — M170 Bilateral primary osteoarthritis of knee: Secondary | ICD-10-CM | POA: Diagnosis not present

## 2022-09-14 DIAGNOSIS — I5023 Acute on chronic systolic (congestive) heart failure: Secondary | ICD-10-CM | POA: Diagnosis not present

## 2022-09-14 DIAGNOSIS — I10 Essential (primary) hypertension: Secondary | ICD-10-CM | POA: Diagnosis not present

## 2022-09-14 DIAGNOSIS — M6281 Muscle weakness (generalized): Secondary | ICD-10-CM | POA: Diagnosis not present

## 2022-09-14 DIAGNOSIS — D509 Iron deficiency anemia, unspecified: Secondary | ICD-10-CM | POA: Diagnosis not present

## 2022-09-14 DIAGNOSIS — R278 Other lack of coordination: Secondary | ICD-10-CM | POA: Diagnosis not present

## 2022-09-14 DIAGNOSIS — E559 Vitamin D deficiency, unspecified: Secondary | ICD-10-CM | POA: Diagnosis not present

## 2022-09-14 DIAGNOSIS — E039 Hypothyroidism, unspecified: Secondary | ICD-10-CM | POA: Diagnosis not present

## 2022-09-17 DIAGNOSIS — I2699 Other pulmonary embolism without acute cor pulmonale: Secondary | ICD-10-CM | POA: Diagnosis not present

## 2022-09-17 DIAGNOSIS — I10 Essential (primary) hypertension: Secondary | ICD-10-CM | POA: Diagnosis not present

## 2022-09-17 DIAGNOSIS — R278 Other lack of coordination: Secondary | ICD-10-CM | POA: Diagnosis not present

## 2022-09-17 DIAGNOSIS — E559 Vitamin D deficiency, unspecified: Secondary | ICD-10-CM | POA: Diagnosis not present

## 2022-09-17 DIAGNOSIS — I5023 Acute on chronic systolic (congestive) heart failure: Secondary | ICD-10-CM | POA: Diagnosis not present

## 2022-09-17 DIAGNOSIS — E039 Hypothyroidism, unspecified: Secondary | ICD-10-CM | POA: Diagnosis not present

## 2022-09-17 DIAGNOSIS — M6281 Muscle weakness (generalized): Secondary | ICD-10-CM | POA: Diagnosis not present

## 2022-09-17 DIAGNOSIS — R293 Abnormal posture: Secondary | ICD-10-CM | POA: Diagnosis not present

## 2022-09-17 DIAGNOSIS — M170 Bilateral primary osteoarthritis of knee: Secondary | ICD-10-CM | POA: Diagnosis not present

## 2022-09-17 DIAGNOSIS — J9601 Acute respiratory failure with hypoxia: Secondary | ICD-10-CM | POA: Diagnosis not present

## 2022-09-17 DIAGNOSIS — L89152 Pressure ulcer of sacral region, stage 2: Secondary | ICD-10-CM | POA: Diagnosis not present

## 2022-09-17 DIAGNOSIS — D509 Iron deficiency anemia, unspecified: Secondary | ICD-10-CM | POA: Diagnosis not present

## 2022-09-18 DIAGNOSIS — I5023 Acute on chronic systolic (congestive) heart failure: Secondary | ICD-10-CM | POA: Diagnosis not present

## 2022-09-18 DIAGNOSIS — E559 Vitamin D deficiency, unspecified: Secondary | ICD-10-CM | POA: Diagnosis not present

## 2022-09-18 DIAGNOSIS — D509 Iron deficiency anemia, unspecified: Secondary | ICD-10-CM | POA: Diagnosis not present

## 2022-09-18 DIAGNOSIS — R278 Other lack of coordination: Secondary | ICD-10-CM | POA: Diagnosis not present

## 2022-09-18 DIAGNOSIS — J9601 Acute respiratory failure with hypoxia: Secondary | ICD-10-CM | POA: Diagnosis not present

## 2022-09-18 DIAGNOSIS — M6281 Muscle weakness (generalized): Secondary | ICD-10-CM | POA: Diagnosis not present

## 2022-09-18 DIAGNOSIS — M170 Bilateral primary osteoarthritis of knee: Secondary | ICD-10-CM | POA: Diagnosis not present

## 2022-09-18 DIAGNOSIS — I10 Essential (primary) hypertension: Secondary | ICD-10-CM | POA: Diagnosis not present

## 2022-09-18 DIAGNOSIS — I2699 Other pulmonary embolism without acute cor pulmonale: Secondary | ICD-10-CM | POA: Diagnosis not present

## 2022-09-18 DIAGNOSIS — E039 Hypothyroidism, unspecified: Secondary | ICD-10-CM | POA: Diagnosis not present

## 2022-09-18 DIAGNOSIS — L89152 Pressure ulcer of sacral region, stage 2: Secondary | ICD-10-CM | POA: Diagnosis not present

## 2022-09-18 DIAGNOSIS — R293 Abnormal posture: Secondary | ICD-10-CM | POA: Diagnosis not present

## 2022-09-19 DIAGNOSIS — E559 Vitamin D deficiency, unspecified: Secondary | ICD-10-CM | POA: Diagnosis not present

## 2022-09-19 DIAGNOSIS — I5023 Acute on chronic systolic (congestive) heart failure: Secondary | ICD-10-CM | POA: Diagnosis not present

## 2022-09-19 DIAGNOSIS — D509 Iron deficiency anemia, unspecified: Secondary | ICD-10-CM | POA: Diagnosis not present

## 2022-09-19 DIAGNOSIS — S31821A Laceration without foreign body of left buttock, initial encounter: Secondary | ICD-10-CM | POA: Diagnosis not present

## 2022-09-19 DIAGNOSIS — E039 Hypothyroidism, unspecified: Secondary | ICD-10-CM | POA: Diagnosis not present

## 2022-09-19 DIAGNOSIS — I2699 Other pulmonary embolism without acute cor pulmonale: Secondary | ICD-10-CM | POA: Diagnosis not present

## 2022-09-19 DIAGNOSIS — I10 Essential (primary) hypertension: Secondary | ICD-10-CM | POA: Diagnosis not present

## 2022-09-19 DIAGNOSIS — M6281 Muscle weakness (generalized): Secondary | ICD-10-CM | POA: Diagnosis not present

## 2022-09-19 DIAGNOSIS — J9601 Acute respiratory failure with hypoxia: Secondary | ICD-10-CM | POA: Diagnosis not present

## 2022-09-19 DIAGNOSIS — R278 Other lack of coordination: Secondary | ICD-10-CM | POA: Diagnosis not present

## 2022-09-19 DIAGNOSIS — R293 Abnormal posture: Secondary | ICD-10-CM | POA: Diagnosis not present

## 2022-09-19 DIAGNOSIS — M170 Bilateral primary osteoarthritis of knee: Secondary | ICD-10-CM | POA: Diagnosis not present

## 2022-09-19 DIAGNOSIS — L89152 Pressure ulcer of sacral region, stage 2: Secondary | ICD-10-CM | POA: Diagnosis not present

## 2022-09-20 DIAGNOSIS — R293 Abnormal posture: Secondary | ICD-10-CM | POA: Diagnosis not present

## 2022-09-20 DIAGNOSIS — I5023 Acute on chronic systolic (congestive) heart failure: Secondary | ICD-10-CM | POA: Diagnosis not present

## 2022-09-20 DIAGNOSIS — R278 Other lack of coordination: Secondary | ICD-10-CM | POA: Diagnosis not present

## 2022-09-20 DIAGNOSIS — E039 Hypothyroidism, unspecified: Secondary | ICD-10-CM | POA: Diagnosis not present

## 2022-09-20 DIAGNOSIS — I10 Essential (primary) hypertension: Secondary | ICD-10-CM | POA: Diagnosis not present

## 2022-09-20 DIAGNOSIS — M170 Bilateral primary osteoarthritis of knee: Secondary | ICD-10-CM | POA: Diagnosis not present

## 2022-09-20 DIAGNOSIS — L89152 Pressure ulcer of sacral region, stage 2: Secondary | ICD-10-CM | POA: Diagnosis not present

## 2022-09-20 DIAGNOSIS — I2699 Other pulmonary embolism without acute cor pulmonale: Secondary | ICD-10-CM | POA: Diagnosis not present

## 2022-09-20 DIAGNOSIS — D509 Iron deficiency anemia, unspecified: Secondary | ICD-10-CM | POA: Diagnosis not present

## 2022-09-20 DIAGNOSIS — E559 Vitamin D deficiency, unspecified: Secondary | ICD-10-CM | POA: Diagnosis not present

## 2022-09-20 DIAGNOSIS — M6281 Muscle weakness (generalized): Secondary | ICD-10-CM | POA: Diagnosis not present

## 2022-09-20 DIAGNOSIS — J9601 Acute respiratory failure with hypoxia: Secondary | ICD-10-CM | POA: Diagnosis not present

## 2022-09-21 DIAGNOSIS — I2699 Other pulmonary embolism without acute cor pulmonale: Secondary | ICD-10-CM | POA: Diagnosis not present

## 2022-09-21 DIAGNOSIS — I10 Essential (primary) hypertension: Secondary | ICD-10-CM | POA: Diagnosis not present

## 2022-09-21 DIAGNOSIS — R293 Abnormal posture: Secondary | ICD-10-CM | POA: Diagnosis not present

## 2022-09-21 DIAGNOSIS — L89152 Pressure ulcer of sacral region, stage 2: Secondary | ICD-10-CM | POA: Diagnosis not present

## 2022-09-21 DIAGNOSIS — E559 Vitamin D deficiency, unspecified: Secondary | ICD-10-CM | POA: Diagnosis not present

## 2022-09-21 DIAGNOSIS — M170 Bilateral primary osteoarthritis of knee: Secondary | ICD-10-CM | POA: Diagnosis not present

## 2022-09-21 DIAGNOSIS — J9601 Acute respiratory failure with hypoxia: Secondary | ICD-10-CM | POA: Diagnosis not present

## 2022-09-21 DIAGNOSIS — R278 Other lack of coordination: Secondary | ICD-10-CM | POA: Diagnosis not present

## 2022-09-21 DIAGNOSIS — I5023 Acute on chronic systolic (congestive) heart failure: Secondary | ICD-10-CM | POA: Diagnosis not present

## 2022-09-21 DIAGNOSIS — E039 Hypothyroidism, unspecified: Secondary | ICD-10-CM | POA: Diagnosis not present

## 2022-09-21 DIAGNOSIS — M6281 Muscle weakness (generalized): Secondary | ICD-10-CM | POA: Diagnosis not present

## 2022-09-21 DIAGNOSIS — D509 Iron deficiency anemia, unspecified: Secondary | ICD-10-CM | POA: Diagnosis not present

## 2022-09-24 DIAGNOSIS — M6281 Muscle weakness (generalized): Secondary | ICD-10-CM | POA: Diagnosis not present

## 2022-09-24 DIAGNOSIS — M170 Bilateral primary osteoarthritis of knee: Secondary | ICD-10-CM | POA: Diagnosis not present

## 2022-09-24 DIAGNOSIS — L89152 Pressure ulcer of sacral region, stage 2: Secondary | ICD-10-CM | POA: Diagnosis not present

## 2022-09-24 DIAGNOSIS — I10 Essential (primary) hypertension: Secondary | ICD-10-CM | POA: Diagnosis not present

## 2022-09-24 DIAGNOSIS — R293 Abnormal posture: Secondary | ICD-10-CM | POA: Diagnosis not present

## 2022-09-24 DIAGNOSIS — E559 Vitamin D deficiency, unspecified: Secondary | ICD-10-CM | POA: Diagnosis not present

## 2022-09-24 DIAGNOSIS — R278 Other lack of coordination: Secondary | ICD-10-CM | POA: Diagnosis not present

## 2022-09-24 DIAGNOSIS — D509 Iron deficiency anemia, unspecified: Secondary | ICD-10-CM | POA: Diagnosis not present

## 2022-09-24 DIAGNOSIS — E039 Hypothyroidism, unspecified: Secondary | ICD-10-CM | POA: Diagnosis not present

## 2022-09-24 DIAGNOSIS — I5023 Acute on chronic systolic (congestive) heart failure: Secondary | ICD-10-CM | POA: Diagnosis not present

## 2022-09-24 DIAGNOSIS — J9601 Acute respiratory failure with hypoxia: Secondary | ICD-10-CM | POA: Diagnosis not present

## 2022-09-24 DIAGNOSIS — I2699 Other pulmonary embolism without acute cor pulmonale: Secondary | ICD-10-CM | POA: Diagnosis not present

## 2022-09-26 DIAGNOSIS — B372 Candidiasis of skin and nail: Secondary | ICD-10-CM | POA: Diagnosis not present

## 2022-10-03 DIAGNOSIS — S31821A Laceration without foreign body of left buttock, initial encounter: Secondary | ICD-10-CM | POA: Diagnosis not present

## 2022-10-05 DIAGNOSIS — S31821A Laceration without foreign body of left buttock, initial encounter: Secondary | ICD-10-CM | POA: Diagnosis not present

## 2022-10-10 DIAGNOSIS — S30810D Abrasion of lower back and pelvis, subsequent encounter: Secondary | ICD-10-CM | POA: Diagnosis not present

## 2022-10-24 DIAGNOSIS — N189 Chronic kidney disease, unspecified: Secondary | ICD-10-CM | POA: Diagnosis not present

## 2022-10-24 DIAGNOSIS — D509 Iron deficiency anemia, unspecified: Secondary | ICD-10-CM | POA: Diagnosis not present

## 2022-10-24 DIAGNOSIS — I82401 Acute embolism and thrombosis of unspecified deep veins of right lower extremity: Secondary | ICD-10-CM | POA: Diagnosis not present

## 2022-10-24 DIAGNOSIS — I129 Hypertensive chronic kidney disease with stage 1 through stage 4 chronic kidney disease, or unspecified chronic kidney disease: Secondary | ICD-10-CM | POA: Diagnosis not present

## 2022-10-24 DIAGNOSIS — Z7901 Long term (current) use of anticoagulants: Secondary | ICD-10-CM | POA: Diagnosis not present

## 2022-10-24 DIAGNOSIS — I2699 Other pulmonary embolism without acute cor pulmonale: Secondary | ICD-10-CM | POA: Diagnosis not present

## 2022-10-30 DIAGNOSIS — G8929 Other chronic pain: Secondary | ICD-10-CM | POA: Diagnosis not present

## 2022-11-27 DIAGNOSIS — G8929 Other chronic pain: Secondary | ICD-10-CM | POA: Diagnosis not present

## 2022-12-09 DIAGNOSIS — Z23 Encounter for immunization: Secondary | ICD-10-CM | POA: Diagnosis not present

## 2022-12-09 DIAGNOSIS — R293 Abnormal posture: Secondary | ICD-10-CM | POA: Diagnosis not present

## 2022-12-09 DIAGNOSIS — E559 Vitamin D deficiency, unspecified: Secondary | ICD-10-CM | POA: Diagnosis not present

## 2022-12-09 DIAGNOSIS — I10 Essential (primary) hypertension: Secondary | ICD-10-CM | POA: Diagnosis not present

## 2022-12-09 DIAGNOSIS — E039 Hypothyroidism, unspecified: Secondary | ICD-10-CM | POA: Diagnosis not present

## 2022-12-09 DIAGNOSIS — R278 Other lack of coordination: Secondary | ICD-10-CM | POA: Diagnosis not present

## 2022-12-09 DIAGNOSIS — D509 Iron deficiency anemia, unspecified: Secondary | ICD-10-CM | POA: Diagnosis not present

## 2022-12-09 DIAGNOSIS — I2699 Other pulmonary embolism without acute cor pulmonale: Secondary | ICD-10-CM | POA: Diagnosis not present

## 2022-12-09 DIAGNOSIS — M170 Bilateral primary osteoarthritis of knee: Secondary | ICD-10-CM | POA: Diagnosis not present

## 2022-12-09 DIAGNOSIS — L89152 Pressure ulcer of sacral region, stage 2: Secondary | ICD-10-CM | POA: Diagnosis not present

## 2022-12-09 DIAGNOSIS — M6281 Muscle weakness (generalized): Secondary | ICD-10-CM | POA: Diagnosis not present

## 2022-12-09 DIAGNOSIS — J9601 Acute respiratory failure with hypoxia: Secondary | ICD-10-CM | POA: Diagnosis not present

## 2022-12-09 DIAGNOSIS — I5023 Acute on chronic systolic (congestive) heart failure: Secondary | ICD-10-CM | POA: Diagnosis not present

## 2022-12-10 DIAGNOSIS — D509 Iron deficiency anemia, unspecified: Secondary | ICD-10-CM | POA: Diagnosis not present

## 2022-12-10 DIAGNOSIS — R293 Abnormal posture: Secondary | ICD-10-CM | POA: Diagnosis not present

## 2022-12-10 DIAGNOSIS — R278 Other lack of coordination: Secondary | ICD-10-CM | POA: Diagnosis not present

## 2022-12-10 DIAGNOSIS — I2699 Other pulmonary embolism without acute cor pulmonale: Secondary | ICD-10-CM | POA: Diagnosis not present

## 2022-12-10 DIAGNOSIS — Z23 Encounter for immunization: Secondary | ICD-10-CM | POA: Diagnosis not present

## 2022-12-10 DIAGNOSIS — L89152 Pressure ulcer of sacral region, stage 2: Secondary | ICD-10-CM | POA: Diagnosis not present

## 2022-12-10 DIAGNOSIS — I5023 Acute on chronic systolic (congestive) heart failure: Secondary | ICD-10-CM | POA: Diagnosis not present

## 2022-12-10 DIAGNOSIS — E039 Hypothyroidism, unspecified: Secondary | ICD-10-CM | POA: Diagnosis not present

## 2022-12-10 DIAGNOSIS — J9601 Acute respiratory failure with hypoxia: Secondary | ICD-10-CM | POA: Diagnosis not present

## 2022-12-10 DIAGNOSIS — M6281 Muscle weakness (generalized): Secondary | ICD-10-CM | POA: Diagnosis not present

## 2022-12-10 DIAGNOSIS — M170 Bilateral primary osteoarthritis of knee: Secondary | ICD-10-CM | POA: Diagnosis not present

## 2022-12-10 DIAGNOSIS — I10 Essential (primary) hypertension: Secondary | ICD-10-CM | POA: Diagnosis not present

## 2022-12-10 DIAGNOSIS — E559 Vitamin D deficiency, unspecified: Secondary | ICD-10-CM | POA: Diagnosis not present

## 2022-12-11 DIAGNOSIS — M170 Bilateral primary osteoarthritis of knee: Secondary | ICD-10-CM | POA: Diagnosis not present

## 2022-12-11 DIAGNOSIS — E559 Vitamin D deficiency, unspecified: Secondary | ICD-10-CM | POA: Diagnosis not present

## 2022-12-11 DIAGNOSIS — Z23 Encounter for immunization: Secondary | ICD-10-CM | POA: Diagnosis not present

## 2022-12-11 DIAGNOSIS — D509 Iron deficiency anemia, unspecified: Secondary | ICD-10-CM | POA: Diagnosis not present

## 2022-12-11 DIAGNOSIS — I2699 Other pulmonary embolism without acute cor pulmonale: Secondary | ICD-10-CM | POA: Diagnosis not present

## 2022-12-11 DIAGNOSIS — I10 Essential (primary) hypertension: Secondary | ICD-10-CM | POA: Diagnosis not present

## 2022-12-11 DIAGNOSIS — M6281 Muscle weakness (generalized): Secondary | ICD-10-CM | POA: Diagnosis not present

## 2022-12-11 DIAGNOSIS — E039 Hypothyroidism, unspecified: Secondary | ICD-10-CM | POA: Diagnosis not present

## 2022-12-11 DIAGNOSIS — L89152 Pressure ulcer of sacral region, stage 2: Secondary | ICD-10-CM | POA: Diagnosis not present

## 2022-12-11 DIAGNOSIS — I5023 Acute on chronic systolic (congestive) heart failure: Secondary | ICD-10-CM | POA: Diagnosis not present

## 2022-12-11 DIAGNOSIS — R293 Abnormal posture: Secondary | ICD-10-CM | POA: Diagnosis not present

## 2022-12-11 DIAGNOSIS — R278 Other lack of coordination: Secondary | ICD-10-CM | POA: Diagnosis not present

## 2022-12-11 DIAGNOSIS — J9601 Acute respiratory failure with hypoxia: Secondary | ICD-10-CM | POA: Diagnosis not present

## 2022-12-12 DIAGNOSIS — I10 Essential (primary) hypertension: Secondary | ICD-10-CM | POA: Diagnosis not present

## 2022-12-12 DIAGNOSIS — E039 Hypothyroidism, unspecified: Secondary | ICD-10-CM | POA: Diagnosis not present

## 2022-12-12 DIAGNOSIS — M6281 Muscle weakness (generalized): Secondary | ICD-10-CM | POA: Diagnosis not present

## 2022-12-12 DIAGNOSIS — R293 Abnormal posture: Secondary | ICD-10-CM | POA: Diagnosis not present

## 2022-12-12 DIAGNOSIS — L89152 Pressure ulcer of sacral region, stage 2: Secondary | ICD-10-CM | POA: Diagnosis not present

## 2022-12-12 DIAGNOSIS — E559 Vitamin D deficiency, unspecified: Secondary | ICD-10-CM | POA: Diagnosis not present

## 2022-12-12 DIAGNOSIS — D509 Iron deficiency anemia, unspecified: Secondary | ICD-10-CM | POA: Diagnosis not present

## 2022-12-12 DIAGNOSIS — I2699 Other pulmonary embolism without acute cor pulmonale: Secondary | ICD-10-CM | POA: Diagnosis not present

## 2022-12-12 DIAGNOSIS — Z23 Encounter for immunization: Secondary | ICD-10-CM | POA: Diagnosis not present

## 2022-12-12 DIAGNOSIS — J9601 Acute respiratory failure with hypoxia: Secondary | ICD-10-CM | POA: Diagnosis not present

## 2022-12-12 DIAGNOSIS — M170 Bilateral primary osteoarthritis of knee: Secondary | ICD-10-CM | POA: Diagnosis not present

## 2022-12-12 DIAGNOSIS — R278 Other lack of coordination: Secondary | ICD-10-CM | POA: Diagnosis not present

## 2022-12-12 DIAGNOSIS — I5023 Acute on chronic systolic (congestive) heart failure: Secondary | ICD-10-CM | POA: Diagnosis not present

## 2022-12-15 DIAGNOSIS — J9601 Acute respiratory failure with hypoxia: Secondary | ICD-10-CM | POA: Diagnosis not present

## 2022-12-15 DIAGNOSIS — R278 Other lack of coordination: Secondary | ICD-10-CM | POA: Diagnosis not present

## 2022-12-15 DIAGNOSIS — L89152 Pressure ulcer of sacral region, stage 2: Secondary | ICD-10-CM | POA: Diagnosis not present

## 2022-12-15 DIAGNOSIS — E559 Vitamin D deficiency, unspecified: Secondary | ICD-10-CM | POA: Diagnosis not present

## 2022-12-15 DIAGNOSIS — M170 Bilateral primary osteoarthritis of knee: Secondary | ICD-10-CM | POA: Diagnosis not present

## 2022-12-15 DIAGNOSIS — E039 Hypothyroidism, unspecified: Secondary | ICD-10-CM | POA: Diagnosis not present

## 2022-12-15 DIAGNOSIS — R293 Abnormal posture: Secondary | ICD-10-CM | POA: Diagnosis not present

## 2022-12-15 DIAGNOSIS — I2699 Other pulmonary embolism without acute cor pulmonale: Secondary | ICD-10-CM | POA: Diagnosis not present

## 2022-12-15 DIAGNOSIS — I5023 Acute on chronic systolic (congestive) heart failure: Secondary | ICD-10-CM | POA: Diagnosis not present

## 2022-12-15 DIAGNOSIS — D509 Iron deficiency anemia, unspecified: Secondary | ICD-10-CM | POA: Diagnosis not present

## 2022-12-15 DIAGNOSIS — I10 Essential (primary) hypertension: Secondary | ICD-10-CM | POA: Diagnosis not present

## 2022-12-15 DIAGNOSIS — Z23 Encounter for immunization: Secondary | ICD-10-CM | POA: Diagnosis not present

## 2022-12-15 DIAGNOSIS — M6281 Muscle weakness (generalized): Secondary | ICD-10-CM | POA: Diagnosis not present

## 2022-12-16 DIAGNOSIS — I10 Essential (primary) hypertension: Secondary | ICD-10-CM | POA: Diagnosis not present

## 2022-12-16 DIAGNOSIS — I2699 Other pulmonary embolism without acute cor pulmonale: Secondary | ICD-10-CM | POA: Diagnosis not present

## 2022-12-16 DIAGNOSIS — D509 Iron deficiency anemia, unspecified: Secondary | ICD-10-CM | POA: Diagnosis not present

## 2022-12-16 DIAGNOSIS — E039 Hypothyroidism, unspecified: Secondary | ICD-10-CM | POA: Diagnosis not present

## 2022-12-16 DIAGNOSIS — R278 Other lack of coordination: Secondary | ICD-10-CM | POA: Diagnosis not present

## 2022-12-16 DIAGNOSIS — M170 Bilateral primary osteoarthritis of knee: Secondary | ICD-10-CM | POA: Diagnosis not present

## 2022-12-16 DIAGNOSIS — I5023 Acute on chronic systolic (congestive) heart failure: Secondary | ICD-10-CM | POA: Diagnosis not present

## 2022-12-16 DIAGNOSIS — M6281 Muscle weakness (generalized): Secondary | ICD-10-CM | POA: Diagnosis not present

## 2022-12-16 DIAGNOSIS — Z23 Encounter for immunization: Secondary | ICD-10-CM | POA: Diagnosis not present

## 2022-12-16 DIAGNOSIS — R293 Abnormal posture: Secondary | ICD-10-CM | POA: Diagnosis not present

## 2022-12-16 DIAGNOSIS — J9601 Acute respiratory failure with hypoxia: Secondary | ICD-10-CM | POA: Diagnosis not present

## 2022-12-16 DIAGNOSIS — L89152 Pressure ulcer of sacral region, stage 2: Secondary | ICD-10-CM | POA: Diagnosis not present

## 2022-12-16 DIAGNOSIS — E559 Vitamin D deficiency, unspecified: Secondary | ICD-10-CM | POA: Diagnosis not present

## 2022-12-17 DIAGNOSIS — R278 Other lack of coordination: Secondary | ICD-10-CM | POA: Diagnosis not present

## 2022-12-17 DIAGNOSIS — I5023 Acute on chronic systolic (congestive) heart failure: Secondary | ICD-10-CM | POA: Diagnosis not present

## 2022-12-17 DIAGNOSIS — J9601 Acute respiratory failure with hypoxia: Secondary | ICD-10-CM | POA: Diagnosis not present

## 2022-12-17 DIAGNOSIS — I10 Essential (primary) hypertension: Secondary | ICD-10-CM | POA: Diagnosis not present

## 2022-12-17 DIAGNOSIS — I2699 Other pulmonary embolism without acute cor pulmonale: Secondary | ICD-10-CM | POA: Diagnosis not present

## 2022-12-17 DIAGNOSIS — M170 Bilateral primary osteoarthritis of knee: Secondary | ICD-10-CM | POA: Diagnosis not present

## 2022-12-17 DIAGNOSIS — Z23 Encounter for immunization: Secondary | ICD-10-CM | POA: Diagnosis not present

## 2022-12-17 DIAGNOSIS — D509 Iron deficiency anemia, unspecified: Secondary | ICD-10-CM | POA: Diagnosis not present

## 2022-12-17 DIAGNOSIS — L89152 Pressure ulcer of sacral region, stage 2: Secondary | ICD-10-CM | POA: Diagnosis not present

## 2022-12-17 DIAGNOSIS — E559 Vitamin D deficiency, unspecified: Secondary | ICD-10-CM | POA: Diagnosis not present

## 2022-12-17 DIAGNOSIS — R293 Abnormal posture: Secondary | ICD-10-CM | POA: Diagnosis not present

## 2022-12-17 DIAGNOSIS — E039 Hypothyroidism, unspecified: Secondary | ICD-10-CM | POA: Diagnosis not present

## 2022-12-17 DIAGNOSIS — M6281 Muscle weakness (generalized): Secondary | ICD-10-CM | POA: Diagnosis not present

## 2022-12-18 DIAGNOSIS — Z23 Encounter for immunization: Secondary | ICD-10-CM | POA: Diagnosis not present

## 2022-12-18 DIAGNOSIS — M6281 Muscle weakness (generalized): Secondary | ICD-10-CM | POA: Diagnosis not present

## 2022-12-18 DIAGNOSIS — I5023 Acute on chronic systolic (congestive) heart failure: Secondary | ICD-10-CM | POA: Diagnosis not present

## 2022-12-18 DIAGNOSIS — L89152 Pressure ulcer of sacral region, stage 2: Secondary | ICD-10-CM | POA: Diagnosis not present

## 2022-12-18 DIAGNOSIS — R293 Abnormal posture: Secondary | ICD-10-CM | POA: Diagnosis not present

## 2022-12-18 DIAGNOSIS — I2699 Other pulmonary embolism without acute cor pulmonale: Secondary | ICD-10-CM | POA: Diagnosis not present

## 2022-12-18 DIAGNOSIS — D509 Iron deficiency anemia, unspecified: Secondary | ICD-10-CM | POA: Diagnosis not present

## 2022-12-18 DIAGNOSIS — I10 Essential (primary) hypertension: Secondary | ICD-10-CM | POA: Diagnosis not present

## 2022-12-18 DIAGNOSIS — E559 Vitamin D deficiency, unspecified: Secondary | ICD-10-CM | POA: Diagnosis not present

## 2022-12-18 DIAGNOSIS — E039 Hypothyroidism, unspecified: Secondary | ICD-10-CM | POA: Diagnosis not present

## 2022-12-18 DIAGNOSIS — M170 Bilateral primary osteoarthritis of knee: Secondary | ICD-10-CM | POA: Diagnosis not present

## 2022-12-18 DIAGNOSIS — J9601 Acute respiratory failure with hypoxia: Secondary | ICD-10-CM | POA: Diagnosis not present

## 2022-12-18 DIAGNOSIS — R278 Other lack of coordination: Secondary | ICD-10-CM | POA: Diagnosis not present

## 2022-12-19 DIAGNOSIS — I2699 Other pulmonary embolism without acute cor pulmonale: Secondary | ICD-10-CM | POA: Diagnosis not present

## 2022-12-19 DIAGNOSIS — D509 Iron deficiency anemia, unspecified: Secondary | ICD-10-CM | POA: Diagnosis not present

## 2022-12-19 DIAGNOSIS — I10 Essential (primary) hypertension: Secondary | ICD-10-CM | POA: Diagnosis not present

## 2022-12-19 DIAGNOSIS — M170 Bilateral primary osteoarthritis of knee: Secondary | ICD-10-CM | POA: Diagnosis not present

## 2022-12-19 DIAGNOSIS — J9601 Acute respiratory failure with hypoxia: Secondary | ICD-10-CM | POA: Diagnosis not present

## 2022-12-19 DIAGNOSIS — R278 Other lack of coordination: Secondary | ICD-10-CM | POA: Diagnosis not present

## 2022-12-19 DIAGNOSIS — M6281 Muscle weakness (generalized): Secondary | ICD-10-CM | POA: Diagnosis not present

## 2022-12-19 DIAGNOSIS — I5023 Acute on chronic systolic (congestive) heart failure: Secondary | ICD-10-CM | POA: Diagnosis not present

## 2022-12-19 DIAGNOSIS — R293 Abnormal posture: Secondary | ICD-10-CM | POA: Diagnosis not present

## 2022-12-19 DIAGNOSIS — Z23 Encounter for immunization: Secondary | ICD-10-CM | POA: Diagnosis not present

## 2022-12-19 DIAGNOSIS — E039 Hypothyroidism, unspecified: Secondary | ICD-10-CM | POA: Diagnosis not present

## 2022-12-19 DIAGNOSIS — L89152 Pressure ulcer of sacral region, stage 2: Secondary | ICD-10-CM | POA: Diagnosis not present

## 2022-12-19 DIAGNOSIS — E559 Vitamin D deficiency, unspecified: Secondary | ICD-10-CM | POA: Diagnosis not present

## 2022-12-20 DIAGNOSIS — J9601 Acute respiratory failure with hypoxia: Secondary | ICD-10-CM | POA: Diagnosis not present

## 2022-12-20 DIAGNOSIS — L89152 Pressure ulcer of sacral region, stage 2: Secondary | ICD-10-CM | POA: Diagnosis not present

## 2022-12-20 DIAGNOSIS — R293 Abnormal posture: Secondary | ICD-10-CM | POA: Diagnosis not present

## 2022-12-20 DIAGNOSIS — E559 Vitamin D deficiency, unspecified: Secondary | ICD-10-CM | POA: Diagnosis not present

## 2022-12-20 DIAGNOSIS — I10 Essential (primary) hypertension: Secondary | ICD-10-CM | POA: Diagnosis not present

## 2022-12-20 DIAGNOSIS — M170 Bilateral primary osteoarthritis of knee: Secondary | ICD-10-CM | POA: Diagnosis not present

## 2022-12-20 DIAGNOSIS — Z23 Encounter for immunization: Secondary | ICD-10-CM | POA: Diagnosis not present

## 2022-12-20 DIAGNOSIS — I2699 Other pulmonary embolism without acute cor pulmonale: Secondary | ICD-10-CM | POA: Diagnosis not present

## 2022-12-20 DIAGNOSIS — D509 Iron deficiency anemia, unspecified: Secondary | ICD-10-CM | POA: Diagnosis not present

## 2022-12-20 DIAGNOSIS — R278 Other lack of coordination: Secondary | ICD-10-CM | POA: Diagnosis not present

## 2022-12-20 DIAGNOSIS — I5023 Acute on chronic systolic (congestive) heart failure: Secondary | ICD-10-CM | POA: Diagnosis not present

## 2022-12-20 DIAGNOSIS — M6281 Muscle weakness (generalized): Secondary | ICD-10-CM | POA: Diagnosis not present

## 2022-12-20 DIAGNOSIS — E039 Hypothyroidism, unspecified: Secondary | ICD-10-CM | POA: Diagnosis not present

## 2022-12-24 DIAGNOSIS — D509 Iron deficiency anemia, unspecified: Secondary | ICD-10-CM | POA: Diagnosis not present

## 2022-12-24 DIAGNOSIS — I2699 Other pulmonary embolism without acute cor pulmonale: Secondary | ICD-10-CM | POA: Diagnosis not present

## 2022-12-24 DIAGNOSIS — J9601 Acute respiratory failure with hypoxia: Secondary | ICD-10-CM | POA: Diagnosis not present

## 2022-12-24 DIAGNOSIS — I5023 Acute on chronic systolic (congestive) heart failure: Secondary | ICD-10-CM | POA: Diagnosis not present

## 2022-12-24 DIAGNOSIS — L89152 Pressure ulcer of sacral region, stage 2: Secondary | ICD-10-CM | POA: Diagnosis not present

## 2022-12-24 DIAGNOSIS — I10 Essential (primary) hypertension: Secondary | ICD-10-CM | POA: Diagnosis not present

## 2022-12-24 DIAGNOSIS — E559 Vitamin D deficiency, unspecified: Secondary | ICD-10-CM | POA: Diagnosis not present

## 2022-12-24 DIAGNOSIS — E039 Hypothyroidism, unspecified: Secondary | ICD-10-CM | POA: Diagnosis not present

## 2022-12-24 DIAGNOSIS — R293 Abnormal posture: Secondary | ICD-10-CM | POA: Diagnosis not present

## 2022-12-24 DIAGNOSIS — R278 Other lack of coordination: Secondary | ICD-10-CM | POA: Diagnosis not present

## 2022-12-24 DIAGNOSIS — M6281 Muscle weakness (generalized): Secondary | ICD-10-CM | POA: Diagnosis not present

## 2022-12-24 DIAGNOSIS — M170 Bilateral primary osteoarthritis of knee: Secondary | ICD-10-CM | POA: Diagnosis not present

## 2022-12-24 DIAGNOSIS — Z23 Encounter for immunization: Secondary | ICD-10-CM | POA: Diagnosis not present

## 2022-12-25 DIAGNOSIS — D509 Iron deficiency anemia, unspecified: Secondary | ICD-10-CM | POA: Diagnosis not present

## 2022-12-25 DIAGNOSIS — Z23 Encounter for immunization: Secondary | ICD-10-CM | POA: Diagnosis not present

## 2022-12-25 DIAGNOSIS — E039 Hypothyroidism, unspecified: Secondary | ICD-10-CM | POA: Diagnosis not present

## 2022-12-25 DIAGNOSIS — L89152 Pressure ulcer of sacral region, stage 2: Secondary | ICD-10-CM | POA: Diagnosis not present

## 2022-12-25 DIAGNOSIS — M170 Bilateral primary osteoarthritis of knee: Secondary | ICD-10-CM | POA: Diagnosis not present

## 2022-12-25 DIAGNOSIS — E559 Vitamin D deficiency, unspecified: Secondary | ICD-10-CM | POA: Diagnosis not present

## 2022-12-25 DIAGNOSIS — Z79899 Other long term (current) drug therapy: Secondary | ICD-10-CM | POA: Diagnosis not present

## 2022-12-25 DIAGNOSIS — I5023 Acute on chronic systolic (congestive) heart failure: Secondary | ICD-10-CM | POA: Diagnosis not present

## 2022-12-25 DIAGNOSIS — R293 Abnormal posture: Secondary | ICD-10-CM | POA: Diagnosis not present

## 2022-12-25 DIAGNOSIS — I10 Essential (primary) hypertension: Secondary | ICD-10-CM | POA: Diagnosis not present

## 2022-12-25 DIAGNOSIS — M6281 Muscle weakness (generalized): Secondary | ICD-10-CM | POA: Diagnosis not present

## 2022-12-25 DIAGNOSIS — J9601 Acute respiratory failure with hypoxia: Secondary | ICD-10-CM | POA: Diagnosis not present

## 2022-12-25 DIAGNOSIS — R278 Other lack of coordination: Secondary | ICD-10-CM | POA: Diagnosis not present

## 2022-12-25 DIAGNOSIS — G8929 Other chronic pain: Secondary | ICD-10-CM | POA: Diagnosis not present

## 2022-12-25 DIAGNOSIS — I2699 Other pulmonary embolism without acute cor pulmonale: Secondary | ICD-10-CM | POA: Diagnosis not present

## 2022-12-26 DIAGNOSIS — I5023 Acute on chronic systolic (congestive) heart failure: Secondary | ICD-10-CM | POA: Diagnosis not present

## 2022-12-26 DIAGNOSIS — M170 Bilateral primary osteoarthritis of knee: Secondary | ICD-10-CM | POA: Diagnosis not present

## 2022-12-26 DIAGNOSIS — Z23 Encounter for immunization: Secondary | ICD-10-CM | POA: Diagnosis not present

## 2022-12-26 DIAGNOSIS — J9601 Acute respiratory failure with hypoxia: Secondary | ICD-10-CM | POA: Diagnosis not present

## 2022-12-26 DIAGNOSIS — E559 Vitamin D deficiency, unspecified: Secondary | ICD-10-CM | POA: Diagnosis not present

## 2022-12-26 DIAGNOSIS — L89152 Pressure ulcer of sacral region, stage 2: Secondary | ICD-10-CM | POA: Diagnosis not present

## 2022-12-26 DIAGNOSIS — E039 Hypothyroidism, unspecified: Secondary | ICD-10-CM | POA: Diagnosis not present

## 2022-12-26 DIAGNOSIS — R293 Abnormal posture: Secondary | ICD-10-CM | POA: Diagnosis not present

## 2022-12-26 DIAGNOSIS — D509 Iron deficiency anemia, unspecified: Secondary | ICD-10-CM | POA: Diagnosis not present

## 2022-12-26 DIAGNOSIS — M6281 Muscle weakness (generalized): Secondary | ICD-10-CM | POA: Diagnosis not present

## 2022-12-26 DIAGNOSIS — I10 Essential (primary) hypertension: Secondary | ICD-10-CM | POA: Diagnosis not present

## 2022-12-26 DIAGNOSIS — R278 Other lack of coordination: Secondary | ICD-10-CM | POA: Diagnosis not present

## 2022-12-26 DIAGNOSIS — I2699 Other pulmonary embolism without acute cor pulmonale: Secondary | ICD-10-CM | POA: Diagnosis not present

## 2022-12-27 DIAGNOSIS — I2699 Other pulmonary embolism without acute cor pulmonale: Secondary | ICD-10-CM | POA: Diagnosis not present

## 2022-12-27 DIAGNOSIS — D509 Iron deficiency anemia, unspecified: Secondary | ICD-10-CM | POA: Diagnosis not present

## 2022-12-27 DIAGNOSIS — I5023 Acute on chronic systolic (congestive) heart failure: Secondary | ICD-10-CM | POA: Diagnosis not present

## 2022-12-27 DIAGNOSIS — L89152 Pressure ulcer of sacral region, stage 2: Secondary | ICD-10-CM | POA: Diagnosis not present

## 2022-12-27 DIAGNOSIS — M6281 Muscle weakness (generalized): Secondary | ICD-10-CM | POA: Diagnosis not present

## 2022-12-27 DIAGNOSIS — E039 Hypothyroidism, unspecified: Secondary | ICD-10-CM | POA: Diagnosis not present

## 2022-12-27 DIAGNOSIS — E559 Vitamin D deficiency, unspecified: Secondary | ICD-10-CM | POA: Diagnosis not present

## 2022-12-27 DIAGNOSIS — R278 Other lack of coordination: Secondary | ICD-10-CM | POA: Diagnosis not present

## 2022-12-27 DIAGNOSIS — M170 Bilateral primary osteoarthritis of knee: Secondary | ICD-10-CM | POA: Diagnosis not present

## 2022-12-27 DIAGNOSIS — R293 Abnormal posture: Secondary | ICD-10-CM | POA: Diagnosis not present

## 2022-12-27 DIAGNOSIS — I10 Essential (primary) hypertension: Secondary | ICD-10-CM | POA: Diagnosis not present

## 2022-12-27 DIAGNOSIS — J9601 Acute respiratory failure with hypoxia: Secondary | ICD-10-CM | POA: Diagnosis not present

## 2022-12-27 DIAGNOSIS — Z23 Encounter for immunization: Secondary | ICD-10-CM | POA: Diagnosis not present

## 2022-12-28 DIAGNOSIS — I10 Essential (primary) hypertension: Secondary | ICD-10-CM | POA: Diagnosis not present

## 2022-12-28 DIAGNOSIS — R278 Other lack of coordination: Secondary | ICD-10-CM | POA: Diagnosis not present

## 2022-12-28 DIAGNOSIS — E039 Hypothyroidism, unspecified: Secondary | ICD-10-CM | POA: Diagnosis not present

## 2022-12-28 DIAGNOSIS — Z23 Encounter for immunization: Secondary | ICD-10-CM | POA: Diagnosis not present

## 2022-12-28 DIAGNOSIS — D509 Iron deficiency anemia, unspecified: Secondary | ICD-10-CM | POA: Diagnosis not present

## 2022-12-28 DIAGNOSIS — M6281 Muscle weakness (generalized): Secondary | ICD-10-CM | POA: Diagnosis not present

## 2022-12-28 DIAGNOSIS — J9601 Acute respiratory failure with hypoxia: Secondary | ICD-10-CM | POA: Diagnosis not present

## 2022-12-28 DIAGNOSIS — E559 Vitamin D deficiency, unspecified: Secondary | ICD-10-CM | POA: Diagnosis not present

## 2022-12-28 DIAGNOSIS — I2699 Other pulmonary embolism without acute cor pulmonale: Secondary | ICD-10-CM | POA: Diagnosis not present

## 2022-12-28 DIAGNOSIS — I5023 Acute on chronic systolic (congestive) heart failure: Secondary | ICD-10-CM | POA: Diagnosis not present

## 2022-12-28 DIAGNOSIS — M170 Bilateral primary osteoarthritis of knee: Secondary | ICD-10-CM | POA: Diagnosis not present

## 2022-12-28 DIAGNOSIS — R293 Abnormal posture: Secondary | ICD-10-CM | POA: Diagnosis not present

## 2022-12-28 DIAGNOSIS — L89152 Pressure ulcer of sacral region, stage 2: Secondary | ICD-10-CM | POA: Diagnosis not present

## 2022-12-31 DIAGNOSIS — I10 Essential (primary) hypertension: Secondary | ICD-10-CM | POA: Diagnosis not present

## 2022-12-31 DIAGNOSIS — E039 Hypothyroidism, unspecified: Secondary | ICD-10-CM | POA: Diagnosis not present

## 2022-12-31 DIAGNOSIS — D509 Iron deficiency anemia, unspecified: Secondary | ICD-10-CM | POA: Diagnosis not present

## 2022-12-31 DIAGNOSIS — R293 Abnormal posture: Secondary | ICD-10-CM | POA: Diagnosis not present

## 2022-12-31 DIAGNOSIS — R278 Other lack of coordination: Secondary | ICD-10-CM | POA: Diagnosis not present

## 2022-12-31 DIAGNOSIS — I5023 Acute on chronic systolic (congestive) heart failure: Secondary | ICD-10-CM | POA: Diagnosis not present

## 2022-12-31 DIAGNOSIS — M6281 Muscle weakness (generalized): Secondary | ICD-10-CM | POA: Diagnosis not present

## 2022-12-31 DIAGNOSIS — E559 Vitamin D deficiency, unspecified: Secondary | ICD-10-CM | POA: Diagnosis not present

## 2022-12-31 DIAGNOSIS — M170 Bilateral primary osteoarthritis of knee: Secondary | ICD-10-CM | POA: Diagnosis not present

## 2022-12-31 DIAGNOSIS — L89152 Pressure ulcer of sacral region, stage 2: Secondary | ICD-10-CM | POA: Diagnosis not present

## 2022-12-31 DIAGNOSIS — I2699 Other pulmonary embolism without acute cor pulmonale: Secondary | ICD-10-CM | POA: Diagnosis not present

## 2022-12-31 DIAGNOSIS — J9601 Acute respiratory failure with hypoxia: Secondary | ICD-10-CM | POA: Diagnosis not present

## 2023-01-01 DIAGNOSIS — M6281 Muscle weakness (generalized): Secondary | ICD-10-CM | POA: Diagnosis not present

## 2023-01-01 DIAGNOSIS — D509 Iron deficiency anemia, unspecified: Secondary | ICD-10-CM | POA: Diagnosis not present

## 2023-01-01 DIAGNOSIS — J9601 Acute respiratory failure with hypoxia: Secondary | ICD-10-CM | POA: Diagnosis not present

## 2023-01-01 DIAGNOSIS — R278 Other lack of coordination: Secondary | ICD-10-CM | POA: Diagnosis not present

## 2023-01-01 DIAGNOSIS — I5023 Acute on chronic systolic (congestive) heart failure: Secondary | ICD-10-CM | POA: Diagnosis not present

## 2023-01-01 DIAGNOSIS — I10 Essential (primary) hypertension: Secondary | ICD-10-CM | POA: Diagnosis not present

## 2023-01-01 DIAGNOSIS — M170 Bilateral primary osteoarthritis of knee: Secondary | ICD-10-CM | POA: Diagnosis not present

## 2023-01-01 DIAGNOSIS — R293 Abnormal posture: Secondary | ICD-10-CM | POA: Diagnosis not present

## 2023-01-01 DIAGNOSIS — L89152 Pressure ulcer of sacral region, stage 2: Secondary | ICD-10-CM | POA: Diagnosis not present

## 2023-01-01 DIAGNOSIS — E039 Hypothyroidism, unspecified: Secondary | ICD-10-CM | POA: Diagnosis not present

## 2023-01-01 DIAGNOSIS — E559 Vitamin D deficiency, unspecified: Secondary | ICD-10-CM | POA: Diagnosis not present

## 2023-01-01 DIAGNOSIS — I2699 Other pulmonary embolism without acute cor pulmonale: Secondary | ICD-10-CM | POA: Diagnosis not present

## 2023-01-02 DIAGNOSIS — R278 Other lack of coordination: Secondary | ICD-10-CM | POA: Diagnosis not present

## 2023-01-02 DIAGNOSIS — I5023 Acute on chronic systolic (congestive) heart failure: Secondary | ICD-10-CM | POA: Diagnosis not present

## 2023-01-02 DIAGNOSIS — D509 Iron deficiency anemia, unspecified: Secondary | ICD-10-CM | POA: Diagnosis not present

## 2023-01-02 DIAGNOSIS — M6281 Muscle weakness (generalized): Secondary | ICD-10-CM | POA: Diagnosis not present

## 2023-01-02 DIAGNOSIS — J9601 Acute respiratory failure with hypoxia: Secondary | ICD-10-CM | POA: Diagnosis not present

## 2023-01-02 DIAGNOSIS — I2699 Other pulmonary embolism without acute cor pulmonale: Secondary | ICD-10-CM | POA: Diagnosis not present

## 2023-01-02 DIAGNOSIS — M170 Bilateral primary osteoarthritis of knee: Secondary | ICD-10-CM | POA: Diagnosis not present

## 2023-01-02 DIAGNOSIS — E559 Vitamin D deficiency, unspecified: Secondary | ICD-10-CM | POA: Diagnosis not present

## 2023-01-02 DIAGNOSIS — L89152 Pressure ulcer of sacral region, stage 2: Secondary | ICD-10-CM | POA: Diagnosis not present

## 2023-01-02 DIAGNOSIS — E039 Hypothyroidism, unspecified: Secondary | ICD-10-CM | POA: Diagnosis not present

## 2023-01-02 DIAGNOSIS — R293 Abnormal posture: Secondary | ICD-10-CM | POA: Diagnosis not present

## 2023-01-02 DIAGNOSIS — I10 Essential (primary) hypertension: Secondary | ICD-10-CM | POA: Diagnosis not present

## 2023-01-03 DIAGNOSIS — E559 Vitamin D deficiency, unspecified: Secondary | ICD-10-CM | POA: Diagnosis not present

## 2023-01-03 DIAGNOSIS — D509 Iron deficiency anemia, unspecified: Secondary | ICD-10-CM | POA: Diagnosis not present

## 2023-01-03 DIAGNOSIS — L89152 Pressure ulcer of sacral region, stage 2: Secondary | ICD-10-CM | POA: Diagnosis not present

## 2023-01-03 DIAGNOSIS — M6281 Muscle weakness (generalized): Secondary | ICD-10-CM | POA: Diagnosis not present

## 2023-01-03 DIAGNOSIS — I10 Essential (primary) hypertension: Secondary | ICD-10-CM | POA: Diagnosis not present

## 2023-01-03 DIAGNOSIS — R293 Abnormal posture: Secondary | ICD-10-CM | POA: Diagnosis not present

## 2023-01-03 DIAGNOSIS — J9601 Acute respiratory failure with hypoxia: Secondary | ICD-10-CM | POA: Diagnosis not present

## 2023-01-03 DIAGNOSIS — I2699 Other pulmonary embolism without acute cor pulmonale: Secondary | ICD-10-CM | POA: Diagnosis not present

## 2023-01-03 DIAGNOSIS — R278 Other lack of coordination: Secondary | ICD-10-CM | POA: Diagnosis not present

## 2023-01-03 DIAGNOSIS — I5023 Acute on chronic systolic (congestive) heart failure: Secondary | ICD-10-CM | POA: Diagnosis not present

## 2023-01-03 DIAGNOSIS — M170 Bilateral primary osteoarthritis of knee: Secondary | ICD-10-CM | POA: Diagnosis not present

## 2023-01-03 DIAGNOSIS — E039 Hypothyroidism, unspecified: Secondary | ICD-10-CM | POA: Diagnosis not present

## 2023-01-04 DIAGNOSIS — M170 Bilateral primary osteoarthritis of knee: Secondary | ICD-10-CM | POA: Diagnosis not present

## 2023-01-04 DIAGNOSIS — D509 Iron deficiency anemia, unspecified: Secondary | ICD-10-CM | POA: Diagnosis not present

## 2023-01-04 DIAGNOSIS — I5023 Acute on chronic systolic (congestive) heart failure: Secondary | ICD-10-CM | POA: Diagnosis not present

## 2023-01-04 DIAGNOSIS — I10 Essential (primary) hypertension: Secondary | ICD-10-CM | POA: Diagnosis not present

## 2023-01-04 DIAGNOSIS — L89152 Pressure ulcer of sacral region, stage 2: Secondary | ICD-10-CM | POA: Diagnosis not present

## 2023-01-04 DIAGNOSIS — J9601 Acute respiratory failure with hypoxia: Secondary | ICD-10-CM | POA: Diagnosis not present

## 2023-01-04 DIAGNOSIS — M6281 Muscle weakness (generalized): Secondary | ICD-10-CM | POA: Diagnosis not present

## 2023-01-04 DIAGNOSIS — E559 Vitamin D deficiency, unspecified: Secondary | ICD-10-CM | POA: Diagnosis not present

## 2023-01-04 DIAGNOSIS — R278 Other lack of coordination: Secondary | ICD-10-CM | POA: Diagnosis not present

## 2023-01-04 DIAGNOSIS — E039 Hypothyroidism, unspecified: Secondary | ICD-10-CM | POA: Diagnosis not present

## 2023-01-04 DIAGNOSIS — I2699 Other pulmonary embolism without acute cor pulmonale: Secondary | ICD-10-CM | POA: Diagnosis not present

## 2023-01-04 DIAGNOSIS — R293 Abnormal posture: Secondary | ICD-10-CM | POA: Diagnosis not present

## 2023-01-07 DIAGNOSIS — I82401 Acute embolism and thrombosis of unspecified deep veins of right lower extremity: Secondary | ICD-10-CM | POA: Diagnosis not present

## 2023-01-07 DIAGNOSIS — N189 Chronic kidney disease, unspecified: Secondary | ICD-10-CM | POA: Diagnosis not present

## 2023-01-07 DIAGNOSIS — I2699 Other pulmonary embolism without acute cor pulmonale: Secondary | ICD-10-CM | POA: Diagnosis not present

## 2023-01-07 DIAGNOSIS — I130 Hypertensive heart and chronic kidney disease with heart failure and stage 1 through stage 4 chronic kidney disease, or unspecified chronic kidney disease: Secondary | ICD-10-CM | POA: Diagnosis not present

## 2023-01-07 DIAGNOSIS — Z7901 Long term (current) use of anticoagulants: Secondary | ICD-10-CM | POA: Diagnosis not present

## 2023-01-07 DIAGNOSIS — I509 Heart failure, unspecified: Secondary | ICD-10-CM | POA: Diagnosis not present

## 2023-01-07 DIAGNOSIS — D509 Iron deficiency anemia, unspecified: Secondary | ICD-10-CM | POA: Diagnosis not present

## 2023-01-07 DIAGNOSIS — E039 Hypothyroidism, unspecified: Secondary | ICD-10-CM | POA: Diagnosis not present

## 2023-01-07 DIAGNOSIS — K5900 Constipation, unspecified: Secondary | ICD-10-CM | POA: Diagnosis not present

## 2023-01-22 DIAGNOSIS — G8929 Other chronic pain: Secondary | ICD-10-CM | POA: Diagnosis not present

## 2023-02-26 DIAGNOSIS — G8929 Other chronic pain: Secondary | ICD-10-CM | POA: Diagnosis not present

## 2023-03-06 DIAGNOSIS — M25651 Stiffness of right hip, not elsewhere classified: Secondary | ICD-10-CM | POA: Diagnosis not present

## 2023-03-06 DIAGNOSIS — J9601 Acute respiratory failure with hypoxia: Secondary | ICD-10-CM | POA: Diagnosis not present

## 2023-03-06 DIAGNOSIS — R1310 Dysphagia, unspecified: Secondary | ICD-10-CM | POA: Diagnosis not present

## 2023-03-06 DIAGNOSIS — M25661 Stiffness of right knee, not elsewhere classified: Secondary | ICD-10-CM | POA: Diagnosis not present

## 2023-03-06 DIAGNOSIS — M170 Bilateral primary osteoarthritis of knee: Secondary | ICD-10-CM | POA: Diagnosis not present

## 2023-03-06 DIAGNOSIS — M25662 Stiffness of left knee, not elsewhere classified: Secondary | ICD-10-CM | POA: Diagnosis not present

## 2023-03-06 DIAGNOSIS — M25652 Stiffness of left hip, not elsewhere classified: Secondary | ICD-10-CM | POA: Diagnosis not present

## 2023-03-07 DIAGNOSIS — M25651 Stiffness of right hip, not elsewhere classified: Secondary | ICD-10-CM | POA: Diagnosis not present

## 2023-03-07 DIAGNOSIS — M25662 Stiffness of left knee, not elsewhere classified: Secondary | ICD-10-CM | POA: Diagnosis not present

## 2023-03-07 DIAGNOSIS — M25661 Stiffness of right knee, not elsewhere classified: Secondary | ICD-10-CM | POA: Diagnosis not present

## 2023-03-07 DIAGNOSIS — M25652 Stiffness of left hip, not elsewhere classified: Secondary | ICD-10-CM | POA: Diagnosis not present

## 2023-03-07 DIAGNOSIS — R1310 Dysphagia, unspecified: Secondary | ICD-10-CM | POA: Diagnosis not present

## 2023-03-07 DIAGNOSIS — M170 Bilateral primary osteoarthritis of knee: Secondary | ICD-10-CM | POA: Diagnosis not present

## 2023-03-07 DIAGNOSIS — J9601 Acute respiratory failure with hypoxia: Secondary | ICD-10-CM | POA: Diagnosis not present

## 2023-03-08 DIAGNOSIS — R1310 Dysphagia, unspecified: Secondary | ICD-10-CM | POA: Diagnosis not present

## 2023-03-08 DIAGNOSIS — M170 Bilateral primary osteoarthritis of knee: Secondary | ICD-10-CM | POA: Diagnosis not present

## 2023-03-08 DIAGNOSIS — J9601 Acute respiratory failure with hypoxia: Secondary | ICD-10-CM | POA: Diagnosis not present

## 2023-03-08 DIAGNOSIS — M25662 Stiffness of left knee, not elsewhere classified: Secondary | ICD-10-CM | POA: Diagnosis not present

## 2023-03-08 DIAGNOSIS — M25652 Stiffness of left hip, not elsewhere classified: Secondary | ICD-10-CM | POA: Diagnosis not present

## 2023-03-08 DIAGNOSIS — M25651 Stiffness of right hip, not elsewhere classified: Secondary | ICD-10-CM | POA: Diagnosis not present

## 2023-03-08 DIAGNOSIS — M25661 Stiffness of right knee, not elsewhere classified: Secondary | ICD-10-CM | POA: Diagnosis not present

## 2023-03-11 DIAGNOSIS — M25661 Stiffness of right knee, not elsewhere classified: Secondary | ICD-10-CM | POA: Diagnosis not present

## 2023-03-11 DIAGNOSIS — M170 Bilateral primary osteoarthritis of knee: Secondary | ICD-10-CM | POA: Diagnosis not present

## 2023-03-11 DIAGNOSIS — R1310 Dysphagia, unspecified: Secondary | ICD-10-CM | POA: Diagnosis not present

## 2023-03-11 DIAGNOSIS — M25662 Stiffness of left knee, not elsewhere classified: Secondary | ICD-10-CM | POA: Diagnosis not present

## 2023-03-11 DIAGNOSIS — M25652 Stiffness of left hip, not elsewhere classified: Secondary | ICD-10-CM | POA: Diagnosis not present

## 2023-03-11 DIAGNOSIS — J9601 Acute respiratory failure with hypoxia: Secondary | ICD-10-CM | POA: Diagnosis not present

## 2023-03-11 DIAGNOSIS — M25651 Stiffness of right hip, not elsewhere classified: Secondary | ICD-10-CM | POA: Diagnosis not present

## 2023-03-12 DIAGNOSIS — M25652 Stiffness of left hip, not elsewhere classified: Secondary | ICD-10-CM | POA: Diagnosis not present

## 2023-03-12 DIAGNOSIS — J9601 Acute respiratory failure with hypoxia: Secondary | ICD-10-CM | POA: Diagnosis not present

## 2023-03-12 DIAGNOSIS — M25661 Stiffness of right knee, not elsewhere classified: Secondary | ICD-10-CM | POA: Diagnosis not present

## 2023-03-12 DIAGNOSIS — M25651 Stiffness of right hip, not elsewhere classified: Secondary | ICD-10-CM | POA: Diagnosis not present

## 2023-03-12 DIAGNOSIS — M25662 Stiffness of left knee, not elsewhere classified: Secondary | ICD-10-CM | POA: Diagnosis not present

## 2023-03-12 DIAGNOSIS — M170 Bilateral primary osteoarthritis of knee: Secondary | ICD-10-CM | POA: Diagnosis not present

## 2023-03-12 DIAGNOSIS — R1310 Dysphagia, unspecified: Secondary | ICD-10-CM | POA: Diagnosis not present

## 2023-03-13 DIAGNOSIS — M25661 Stiffness of right knee, not elsewhere classified: Secondary | ICD-10-CM | POA: Diagnosis not present

## 2023-03-13 DIAGNOSIS — M25652 Stiffness of left hip, not elsewhere classified: Secondary | ICD-10-CM | POA: Diagnosis not present

## 2023-03-13 DIAGNOSIS — M25651 Stiffness of right hip, not elsewhere classified: Secondary | ICD-10-CM | POA: Diagnosis not present

## 2023-03-13 DIAGNOSIS — J9601 Acute respiratory failure with hypoxia: Secondary | ICD-10-CM | POA: Diagnosis not present

## 2023-03-13 DIAGNOSIS — M170 Bilateral primary osteoarthritis of knee: Secondary | ICD-10-CM | POA: Diagnosis not present

## 2023-03-13 DIAGNOSIS — M25662 Stiffness of left knee, not elsewhere classified: Secondary | ICD-10-CM | POA: Diagnosis not present

## 2023-03-13 DIAGNOSIS — R1310 Dysphagia, unspecified: Secondary | ICD-10-CM | POA: Diagnosis not present

## 2023-03-14 DIAGNOSIS — R1310 Dysphagia, unspecified: Secondary | ICD-10-CM | POA: Diagnosis not present

## 2023-03-14 DIAGNOSIS — M170 Bilateral primary osteoarthritis of knee: Secondary | ICD-10-CM | POA: Diagnosis not present

## 2023-03-14 DIAGNOSIS — J9601 Acute respiratory failure with hypoxia: Secondary | ICD-10-CM | POA: Diagnosis not present

## 2023-03-14 DIAGNOSIS — M25662 Stiffness of left knee, not elsewhere classified: Secondary | ICD-10-CM | POA: Diagnosis not present

## 2023-03-14 DIAGNOSIS — M25661 Stiffness of right knee, not elsewhere classified: Secondary | ICD-10-CM | POA: Diagnosis not present

## 2023-03-14 DIAGNOSIS — M25652 Stiffness of left hip, not elsewhere classified: Secondary | ICD-10-CM | POA: Diagnosis not present

## 2023-03-14 DIAGNOSIS — M25651 Stiffness of right hip, not elsewhere classified: Secondary | ICD-10-CM | POA: Diagnosis not present

## 2023-03-15 DIAGNOSIS — R1310 Dysphagia, unspecified: Secondary | ICD-10-CM | POA: Diagnosis not present

## 2023-03-15 DIAGNOSIS — M25661 Stiffness of right knee, not elsewhere classified: Secondary | ICD-10-CM | POA: Diagnosis not present

## 2023-03-15 DIAGNOSIS — J9601 Acute respiratory failure with hypoxia: Secondary | ICD-10-CM | POA: Diagnosis not present

## 2023-03-15 DIAGNOSIS — M25651 Stiffness of right hip, not elsewhere classified: Secondary | ICD-10-CM | POA: Diagnosis not present

## 2023-03-15 DIAGNOSIS — M170 Bilateral primary osteoarthritis of knee: Secondary | ICD-10-CM | POA: Diagnosis not present

## 2023-03-15 DIAGNOSIS — M25652 Stiffness of left hip, not elsewhere classified: Secondary | ICD-10-CM | POA: Diagnosis not present

## 2023-03-15 DIAGNOSIS — M25662 Stiffness of left knee, not elsewhere classified: Secondary | ICD-10-CM | POA: Diagnosis not present

## 2023-03-18 DIAGNOSIS — J9601 Acute respiratory failure with hypoxia: Secondary | ICD-10-CM | POA: Diagnosis not present

## 2023-03-18 DIAGNOSIS — M25652 Stiffness of left hip, not elsewhere classified: Secondary | ICD-10-CM | POA: Diagnosis not present

## 2023-03-18 DIAGNOSIS — R1310 Dysphagia, unspecified: Secondary | ICD-10-CM | POA: Diagnosis not present

## 2023-03-18 DIAGNOSIS — M25651 Stiffness of right hip, not elsewhere classified: Secondary | ICD-10-CM | POA: Diagnosis not present

## 2023-03-18 DIAGNOSIS — M170 Bilateral primary osteoarthritis of knee: Secondary | ICD-10-CM | POA: Diagnosis not present

## 2023-03-18 DIAGNOSIS — M25662 Stiffness of left knee, not elsewhere classified: Secondary | ICD-10-CM | POA: Diagnosis not present

## 2023-03-18 DIAGNOSIS — M25661 Stiffness of right knee, not elsewhere classified: Secondary | ICD-10-CM | POA: Diagnosis not present

## 2023-03-19 DIAGNOSIS — M25661 Stiffness of right knee, not elsewhere classified: Secondary | ICD-10-CM | POA: Diagnosis not present

## 2023-03-19 DIAGNOSIS — M25652 Stiffness of left hip, not elsewhere classified: Secondary | ICD-10-CM | POA: Diagnosis not present

## 2023-03-19 DIAGNOSIS — R1310 Dysphagia, unspecified: Secondary | ICD-10-CM | POA: Diagnosis not present

## 2023-03-19 DIAGNOSIS — M25651 Stiffness of right hip, not elsewhere classified: Secondary | ICD-10-CM | POA: Diagnosis not present

## 2023-03-19 DIAGNOSIS — D649 Anemia, unspecified: Secondary | ICD-10-CM | POA: Diagnosis not present

## 2023-03-19 DIAGNOSIS — M170 Bilateral primary osteoarthritis of knee: Secondary | ICD-10-CM | POA: Diagnosis not present

## 2023-03-19 DIAGNOSIS — M25662 Stiffness of left knee, not elsewhere classified: Secondary | ICD-10-CM | POA: Diagnosis not present

## 2023-03-19 DIAGNOSIS — J9601 Acute respiratory failure with hypoxia: Secondary | ICD-10-CM | POA: Diagnosis not present

## 2023-03-20 DIAGNOSIS — D509 Iron deficiency anemia, unspecified: Secondary | ICD-10-CM | POA: Diagnosis not present

## 2023-03-20 DIAGNOSIS — M25651 Stiffness of right hip, not elsewhere classified: Secondary | ICD-10-CM | POA: Diagnosis not present

## 2023-03-20 DIAGNOSIS — M25662 Stiffness of left knee, not elsewhere classified: Secondary | ICD-10-CM | POA: Diagnosis not present

## 2023-03-20 DIAGNOSIS — M170 Bilateral primary osteoarthritis of knee: Secondary | ICD-10-CM | POA: Diagnosis not present

## 2023-03-20 DIAGNOSIS — M25652 Stiffness of left hip, not elsewhere classified: Secondary | ICD-10-CM | POA: Diagnosis not present

## 2023-03-20 DIAGNOSIS — R1310 Dysphagia, unspecified: Secondary | ICD-10-CM | POA: Diagnosis not present

## 2023-03-20 DIAGNOSIS — J9601 Acute respiratory failure with hypoxia: Secondary | ICD-10-CM | POA: Diagnosis not present

## 2023-03-20 DIAGNOSIS — I2699 Other pulmonary embolism without acute cor pulmonale: Secondary | ICD-10-CM | POA: Diagnosis not present

## 2023-03-20 DIAGNOSIS — M25661 Stiffness of right knee, not elsewhere classified: Secondary | ICD-10-CM | POA: Diagnosis not present

## 2023-03-22 DIAGNOSIS — M25662 Stiffness of left knee, not elsewhere classified: Secondary | ICD-10-CM | POA: Diagnosis not present

## 2023-03-22 DIAGNOSIS — M25661 Stiffness of right knee, not elsewhere classified: Secondary | ICD-10-CM | POA: Diagnosis not present

## 2023-03-22 DIAGNOSIS — M25652 Stiffness of left hip, not elsewhere classified: Secondary | ICD-10-CM | POA: Diagnosis not present

## 2023-03-22 DIAGNOSIS — M25651 Stiffness of right hip, not elsewhere classified: Secondary | ICD-10-CM | POA: Diagnosis not present

## 2023-03-22 DIAGNOSIS — J9601 Acute respiratory failure with hypoxia: Secondary | ICD-10-CM | POA: Diagnosis not present

## 2023-03-22 DIAGNOSIS — R1310 Dysphagia, unspecified: Secondary | ICD-10-CM | POA: Diagnosis not present

## 2023-03-22 DIAGNOSIS — M170 Bilateral primary osteoarthritis of knee: Secondary | ICD-10-CM | POA: Diagnosis not present

## 2023-03-25 DIAGNOSIS — R1310 Dysphagia, unspecified: Secondary | ICD-10-CM | POA: Diagnosis not present

## 2023-03-25 DIAGNOSIS — M25652 Stiffness of left hip, not elsewhere classified: Secondary | ICD-10-CM | POA: Diagnosis not present

## 2023-03-25 DIAGNOSIS — M170 Bilateral primary osteoarthritis of knee: Secondary | ICD-10-CM | POA: Diagnosis not present

## 2023-03-25 DIAGNOSIS — M25662 Stiffness of left knee, not elsewhere classified: Secondary | ICD-10-CM | POA: Diagnosis not present

## 2023-03-25 DIAGNOSIS — J9601 Acute respiratory failure with hypoxia: Secondary | ICD-10-CM | POA: Diagnosis not present

## 2023-03-25 DIAGNOSIS — M25651 Stiffness of right hip, not elsewhere classified: Secondary | ICD-10-CM | POA: Diagnosis not present

## 2023-03-25 DIAGNOSIS — M25661 Stiffness of right knee, not elsewhere classified: Secondary | ICD-10-CM | POA: Diagnosis not present

## 2023-03-26 DIAGNOSIS — G8929 Other chronic pain: Secondary | ICD-10-CM | POA: Diagnosis not present

## 2023-03-26 DIAGNOSIS — R1310 Dysphagia, unspecified: Secondary | ICD-10-CM | POA: Diagnosis not present

## 2023-03-26 DIAGNOSIS — Z79899 Other long term (current) drug therapy: Secondary | ICD-10-CM | POA: Diagnosis not present

## 2023-03-26 DIAGNOSIS — M170 Bilateral primary osteoarthritis of knee: Secondary | ICD-10-CM | POA: Diagnosis not present

## 2023-03-26 DIAGNOSIS — J9601 Acute respiratory failure with hypoxia: Secondary | ICD-10-CM | POA: Diagnosis not present

## 2023-03-26 DIAGNOSIS — M25662 Stiffness of left knee, not elsewhere classified: Secondary | ICD-10-CM | POA: Diagnosis not present

## 2023-03-26 DIAGNOSIS — M25651 Stiffness of right hip, not elsewhere classified: Secondary | ICD-10-CM | POA: Diagnosis not present

## 2023-03-26 DIAGNOSIS — M25652 Stiffness of left hip, not elsewhere classified: Secondary | ICD-10-CM | POA: Diagnosis not present

## 2023-03-26 DIAGNOSIS — M25661 Stiffness of right knee, not elsewhere classified: Secondary | ICD-10-CM | POA: Diagnosis not present

## 2023-03-27 DIAGNOSIS — M25661 Stiffness of right knee, not elsewhere classified: Secondary | ICD-10-CM | POA: Diagnosis not present

## 2023-03-27 DIAGNOSIS — M25651 Stiffness of right hip, not elsewhere classified: Secondary | ICD-10-CM | POA: Diagnosis not present

## 2023-03-27 DIAGNOSIS — R1310 Dysphagia, unspecified: Secondary | ICD-10-CM | POA: Diagnosis not present

## 2023-03-27 DIAGNOSIS — J9601 Acute respiratory failure with hypoxia: Secondary | ICD-10-CM | POA: Diagnosis not present

## 2023-03-27 DIAGNOSIS — M170 Bilateral primary osteoarthritis of knee: Secondary | ICD-10-CM | POA: Diagnosis not present

## 2023-03-27 DIAGNOSIS — M25652 Stiffness of left hip, not elsewhere classified: Secondary | ICD-10-CM | POA: Diagnosis not present

## 2023-03-27 DIAGNOSIS — M25662 Stiffness of left knee, not elsewhere classified: Secondary | ICD-10-CM | POA: Diagnosis not present

## 2023-04-01 DIAGNOSIS — Z741 Need for assistance with personal care: Secondary | ICD-10-CM | POA: Diagnosis not present

## 2023-04-01 DIAGNOSIS — M25549 Pain in joints of unspecified hand: Secondary | ICD-10-CM | POA: Diagnosis not present

## 2023-04-01 DIAGNOSIS — M25652 Stiffness of left hip, not elsewhere classified: Secondary | ICD-10-CM | POA: Diagnosis not present

## 2023-04-01 DIAGNOSIS — M6281 Muscle weakness (generalized): Secondary | ICD-10-CM | POA: Diagnosis not present

## 2023-04-01 DIAGNOSIS — M170 Bilateral primary osteoarthritis of knee: Secondary | ICD-10-CM | POA: Diagnosis not present

## 2023-04-01 DIAGNOSIS — M25662 Stiffness of left knee, not elsewhere classified: Secondary | ICD-10-CM | POA: Diagnosis not present

## 2023-04-01 DIAGNOSIS — M25651 Stiffness of right hip, not elsewhere classified: Secondary | ICD-10-CM | POA: Diagnosis not present

## 2023-04-01 DIAGNOSIS — M25661 Stiffness of right knee, not elsewhere classified: Secondary | ICD-10-CM | POA: Diagnosis not present

## 2023-04-02 DIAGNOSIS — M25662 Stiffness of left knee, not elsewhere classified: Secondary | ICD-10-CM | POA: Diagnosis not present

## 2023-04-02 DIAGNOSIS — M6281 Muscle weakness (generalized): Secondary | ICD-10-CM | POA: Diagnosis not present

## 2023-04-02 DIAGNOSIS — M25651 Stiffness of right hip, not elsewhere classified: Secondary | ICD-10-CM | POA: Diagnosis not present

## 2023-04-02 DIAGNOSIS — M25549 Pain in joints of unspecified hand: Secondary | ICD-10-CM | POA: Diagnosis not present

## 2023-04-02 DIAGNOSIS — M25652 Stiffness of left hip, not elsewhere classified: Secondary | ICD-10-CM | POA: Diagnosis not present

## 2023-04-02 DIAGNOSIS — Z741 Need for assistance with personal care: Secondary | ICD-10-CM | POA: Diagnosis not present

## 2023-04-02 DIAGNOSIS — M170 Bilateral primary osteoarthritis of knee: Secondary | ICD-10-CM | POA: Diagnosis not present

## 2023-04-02 DIAGNOSIS — M25661 Stiffness of right knee, not elsewhere classified: Secondary | ICD-10-CM | POA: Diagnosis not present

## 2023-04-03 DIAGNOSIS — M170 Bilateral primary osteoarthritis of knee: Secondary | ICD-10-CM | POA: Diagnosis not present

## 2023-04-03 DIAGNOSIS — M6281 Muscle weakness (generalized): Secondary | ICD-10-CM | POA: Diagnosis not present

## 2023-04-03 DIAGNOSIS — M25651 Stiffness of right hip, not elsewhere classified: Secondary | ICD-10-CM | POA: Diagnosis not present

## 2023-04-03 DIAGNOSIS — M25661 Stiffness of right knee, not elsewhere classified: Secondary | ICD-10-CM | POA: Diagnosis not present

## 2023-04-03 DIAGNOSIS — Z741 Need for assistance with personal care: Secondary | ICD-10-CM | POA: Diagnosis not present

## 2023-04-03 DIAGNOSIS — M25549 Pain in joints of unspecified hand: Secondary | ICD-10-CM | POA: Diagnosis not present

## 2023-04-03 DIAGNOSIS — M25652 Stiffness of left hip, not elsewhere classified: Secondary | ICD-10-CM | POA: Diagnosis not present

## 2023-04-03 DIAGNOSIS — M25662 Stiffness of left knee, not elsewhere classified: Secondary | ICD-10-CM | POA: Diagnosis not present

## 2023-05-06 DIAGNOSIS — K5900 Constipation, unspecified: Secondary | ICD-10-CM | POA: Diagnosis not present

## 2023-05-06 DIAGNOSIS — Z7901 Long term (current) use of anticoagulants: Secondary | ICD-10-CM | POA: Diagnosis not present

## 2023-05-06 DIAGNOSIS — M159 Polyosteoarthritis, unspecified: Secondary | ICD-10-CM | POA: Diagnosis not present

## 2023-05-06 DIAGNOSIS — D509 Iron deficiency anemia, unspecified: Secondary | ICD-10-CM | POA: Diagnosis not present

## 2023-05-06 DIAGNOSIS — I509 Heart failure, unspecified: Secondary | ICD-10-CM | POA: Diagnosis not present

## 2023-05-06 DIAGNOSIS — I2699 Other pulmonary embolism without acute cor pulmonale: Secondary | ICD-10-CM | POA: Diagnosis not present

## 2023-05-06 DIAGNOSIS — E039 Hypothyroidism, unspecified: Secondary | ICD-10-CM | POA: Diagnosis not present

## 2023-05-06 DIAGNOSIS — I130 Hypertensive heart and chronic kidney disease with heart failure and stage 1 through stage 4 chronic kidney disease, or unspecified chronic kidney disease: Secondary | ICD-10-CM | POA: Diagnosis not present

## 2023-05-06 DIAGNOSIS — N189 Chronic kidney disease, unspecified: Secondary | ICD-10-CM | POA: Diagnosis not present

## 2023-05-06 DIAGNOSIS — K219 Gastro-esophageal reflux disease without esophagitis: Secondary | ICD-10-CM | POA: Diagnosis not present

## 2023-05-06 DIAGNOSIS — E785 Hyperlipidemia, unspecified: Secondary | ICD-10-CM | POA: Diagnosis not present

## 2023-05-21 DIAGNOSIS — M25652 Stiffness of left hip, not elsewhere classified: Secondary | ICD-10-CM | POA: Diagnosis not present

## 2023-05-21 DIAGNOSIS — M25651 Stiffness of right hip, not elsewhere classified: Secondary | ICD-10-CM | POA: Diagnosis not present

## 2023-05-21 DIAGNOSIS — M25662 Stiffness of left knee, not elsewhere classified: Secondary | ICD-10-CM | POA: Diagnosis not present

## 2023-05-21 DIAGNOSIS — M170 Bilateral primary osteoarthritis of knee: Secondary | ICD-10-CM | POA: Diagnosis not present

## 2023-05-21 DIAGNOSIS — G8929 Other chronic pain: Secondary | ICD-10-CM | POA: Diagnosis not present

## 2023-05-21 DIAGNOSIS — M6281 Muscle weakness (generalized): Secondary | ICD-10-CM | POA: Diagnosis not present

## 2023-05-21 DIAGNOSIS — Z741 Need for assistance with personal care: Secondary | ICD-10-CM | POA: Diagnosis not present

## 2023-05-21 DIAGNOSIS — M25661 Stiffness of right knee, not elsewhere classified: Secondary | ICD-10-CM | POA: Diagnosis not present

## 2023-05-21 DIAGNOSIS — M25549 Pain in joints of unspecified hand: Secondary | ICD-10-CM | POA: Diagnosis not present

## 2023-05-22 DIAGNOSIS — M25652 Stiffness of left hip, not elsewhere classified: Secondary | ICD-10-CM | POA: Diagnosis not present

## 2023-05-22 DIAGNOSIS — Z741 Need for assistance with personal care: Secondary | ICD-10-CM | POA: Diagnosis not present

## 2023-05-22 DIAGNOSIS — M6281 Muscle weakness (generalized): Secondary | ICD-10-CM | POA: Diagnosis not present

## 2023-05-22 DIAGNOSIS — M25661 Stiffness of right knee, not elsewhere classified: Secondary | ICD-10-CM | POA: Diagnosis not present

## 2023-05-22 DIAGNOSIS — M25549 Pain in joints of unspecified hand: Secondary | ICD-10-CM | POA: Diagnosis not present

## 2023-05-22 DIAGNOSIS — M170 Bilateral primary osteoarthritis of knee: Secondary | ICD-10-CM | POA: Diagnosis not present

## 2023-05-22 DIAGNOSIS — M25651 Stiffness of right hip, not elsewhere classified: Secondary | ICD-10-CM | POA: Diagnosis not present

## 2023-05-22 DIAGNOSIS — M25662 Stiffness of left knee, not elsewhere classified: Secondary | ICD-10-CM | POA: Diagnosis not present

## 2023-05-23 DIAGNOSIS — M6281 Muscle weakness (generalized): Secondary | ICD-10-CM | POA: Diagnosis not present

## 2023-05-23 DIAGNOSIS — M25661 Stiffness of right knee, not elsewhere classified: Secondary | ICD-10-CM | POA: Diagnosis not present

## 2023-05-23 DIAGNOSIS — M25651 Stiffness of right hip, not elsewhere classified: Secondary | ICD-10-CM | POA: Diagnosis not present

## 2023-05-23 DIAGNOSIS — M170 Bilateral primary osteoarthritis of knee: Secondary | ICD-10-CM | POA: Diagnosis not present

## 2023-05-23 DIAGNOSIS — M25549 Pain in joints of unspecified hand: Secondary | ICD-10-CM | POA: Diagnosis not present

## 2023-05-23 DIAGNOSIS — M25662 Stiffness of left knee, not elsewhere classified: Secondary | ICD-10-CM | POA: Diagnosis not present

## 2023-05-23 DIAGNOSIS — M25652 Stiffness of left hip, not elsewhere classified: Secondary | ICD-10-CM | POA: Diagnosis not present

## 2023-05-23 DIAGNOSIS — Z741 Need for assistance with personal care: Secondary | ICD-10-CM | POA: Diagnosis not present

## 2023-05-24 DIAGNOSIS — M25651 Stiffness of right hip, not elsewhere classified: Secondary | ICD-10-CM | POA: Diagnosis not present

## 2023-05-24 DIAGNOSIS — M25549 Pain in joints of unspecified hand: Secondary | ICD-10-CM | POA: Diagnosis not present

## 2023-05-24 DIAGNOSIS — M25662 Stiffness of left knee, not elsewhere classified: Secondary | ICD-10-CM | POA: Diagnosis not present

## 2023-05-24 DIAGNOSIS — M170 Bilateral primary osteoarthritis of knee: Secondary | ICD-10-CM | POA: Diagnosis not present

## 2023-05-24 DIAGNOSIS — M25652 Stiffness of left hip, not elsewhere classified: Secondary | ICD-10-CM | POA: Diagnosis not present

## 2023-05-24 DIAGNOSIS — M25661 Stiffness of right knee, not elsewhere classified: Secondary | ICD-10-CM | POA: Diagnosis not present

## 2023-05-24 DIAGNOSIS — Z741 Need for assistance with personal care: Secondary | ICD-10-CM | POA: Diagnosis not present

## 2023-05-24 DIAGNOSIS — M6281 Muscle weakness (generalized): Secondary | ICD-10-CM | POA: Diagnosis not present

## 2023-05-27 DIAGNOSIS — M25662 Stiffness of left knee, not elsewhere classified: Secondary | ICD-10-CM | POA: Diagnosis not present

## 2023-05-27 DIAGNOSIS — Z741 Need for assistance with personal care: Secondary | ICD-10-CM | POA: Diagnosis not present

## 2023-05-27 DIAGNOSIS — M25651 Stiffness of right hip, not elsewhere classified: Secondary | ICD-10-CM | POA: Diagnosis not present

## 2023-05-27 DIAGNOSIS — M25652 Stiffness of left hip, not elsewhere classified: Secondary | ICD-10-CM | POA: Diagnosis not present

## 2023-05-27 DIAGNOSIS — M6281 Muscle weakness (generalized): Secondary | ICD-10-CM | POA: Diagnosis not present

## 2023-05-27 DIAGNOSIS — M25661 Stiffness of right knee, not elsewhere classified: Secondary | ICD-10-CM | POA: Diagnosis not present

## 2023-05-27 DIAGNOSIS — M170 Bilateral primary osteoarthritis of knee: Secondary | ICD-10-CM | POA: Diagnosis not present

## 2023-05-27 DIAGNOSIS — M25549 Pain in joints of unspecified hand: Secondary | ICD-10-CM | POA: Diagnosis not present

## 2023-05-28 DIAGNOSIS — M25662 Stiffness of left knee, not elsewhere classified: Secondary | ICD-10-CM | POA: Diagnosis not present

## 2023-05-28 DIAGNOSIS — M25651 Stiffness of right hip, not elsewhere classified: Secondary | ICD-10-CM | POA: Diagnosis not present

## 2023-05-28 DIAGNOSIS — D649 Anemia, unspecified: Secondary | ICD-10-CM | POA: Diagnosis not present

## 2023-05-28 DIAGNOSIS — M25549 Pain in joints of unspecified hand: Secondary | ICD-10-CM | POA: Diagnosis not present

## 2023-05-28 DIAGNOSIS — M25661 Stiffness of right knee, not elsewhere classified: Secondary | ICD-10-CM | POA: Diagnosis not present

## 2023-05-28 DIAGNOSIS — M170 Bilateral primary osteoarthritis of knee: Secondary | ICD-10-CM | POA: Diagnosis not present

## 2023-05-28 DIAGNOSIS — M6281 Muscle weakness (generalized): Secondary | ICD-10-CM | POA: Diagnosis not present

## 2023-05-28 DIAGNOSIS — Z741 Need for assistance with personal care: Secondary | ICD-10-CM | POA: Diagnosis not present

## 2023-05-28 DIAGNOSIS — M25652 Stiffness of left hip, not elsewhere classified: Secondary | ICD-10-CM | POA: Diagnosis not present

## 2023-05-29 DIAGNOSIS — Z741 Need for assistance with personal care: Secondary | ICD-10-CM | POA: Diagnosis not present

## 2023-05-29 DIAGNOSIS — M25662 Stiffness of left knee, not elsewhere classified: Secondary | ICD-10-CM | POA: Diagnosis not present

## 2023-05-29 DIAGNOSIS — M170 Bilateral primary osteoarthritis of knee: Secondary | ICD-10-CM | POA: Diagnosis not present

## 2023-05-29 DIAGNOSIS — M25652 Stiffness of left hip, not elsewhere classified: Secondary | ICD-10-CM | POA: Diagnosis not present

## 2023-05-29 DIAGNOSIS — M25651 Stiffness of right hip, not elsewhere classified: Secondary | ICD-10-CM | POA: Diagnosis not present

## 2023-05-29 DIAGNOSIS — M6281 Muscle weakness (generalized): Secondary | ICD-10-CM | POA: Diagnosis not present

## 2023-05-29 DIAGNOSIS — M25549 Pain in joints of unspecified hand: Secondary | ICD-10-CM | POA: Diagnosis not present

## 2023-05-29 DIAGNOSIS — M25661 Stiffness of right knee, not elsewhere classified: Secondary | ICD-10-CM | POA: Diagnosis not present

## 2023-05-30 DIAGNOSIS — M25661 Stiffness of right knee, not elsewhere classified: Secondary | ICD-10-CM | POA: Diagnosis not present

## 2023-05-30 DIAGNOSIS — M25652 Stiffness of left hip, not elsewhere classified: Secondary | ICD-10-CM | POA: Diagnosis not present

## 2023-05-30 DIAGNOSIS — M25651 Stiffness of right hip, not elsewhere classified: Secondary | ICD-10-CM | POA: Diagnosis not present

## 2023-05-30 DIAGNOSIS — M6281 Muscle weakness (generalized): Secondary | ICD-10-CM | POA: Diagnosis not present

## 2023-05-30 DIAGNOSIS — Z741 Need for assistance with personal care: Secondary | ICD-10-CM | POA: Diagnosis not present

## 2023-05-30 DIAGNOSIS — M25662 Stiffness of left knee, not elsewhere classified: Secondary | ICD-10-CM | POA: Diagnosis not present

## 2023-05-30 DIAGNOSIS — M25549 Pain in joints of unspecified hand: Secondary | ICD-10-CM | POA: Diagnosis not present

## 2023-05-30 DIAGNOSIS — M170 Bilateral primary osteoarthritis of knee: Secondary | ICD-10-CM | POA: Diagnosis not present

## 2023-05-31 DIAGNOSIS — M25652 Stiffness of left hip, not elsewhere classified: Secondary | ICD-10-CM | POA: Diagnosis not present

## 2023-05-31 DIAGNOSIS — M6281 Muscle weakness (generalized): Secondary | ICD-10-CM | POA: Diagnosis not present

## 2023-05-31 DIAGNOSIS — M170 Bilateral primary osteoarthritis of knee: Secondary | ICD-10-CM | POA: Diagnosis not present

## 2023-05-31 DIAGNOSIS — M25651 Stiffness of right hip, not elsewhere classified: Secondary | ICD-10-CM | POA: Diagnosis not present

## 2023-05-31 DIAGNOSIS — M25661 Stiffness of right knee, not elsewhere classified: Secondary | ICD-10-CM | POA: Diagnosis not present

## 2023-05-31 DIAGNOSIS — M25549 Pain in joints of unspecified hand: Secondary | ICD-10-CM | POA: Diagnosis not present

## 2023-05-31 DIAGNOSIS — M25662 Stiffness of left knee, not elsewhere classified: Secondary | ICD-10-CM | POA: Diagnosis not present

## 2023-05-31 DIAGNOSIS — Z741 Need for assistance with personal care: Secondary | ICD-10-CM | POA: Diagnosis not present

## 2023-06-03 DIAGNOSIS — R279 Unspecified lack of coordination: Secondary | ICD-10-CM | POA: Diagnosis not present

## 2023-06-03 DIAGNOSIS — M170 Bilateral primary osteoarthritis of knee: Secondary | ICD-10-CM | POA: Diagnosis not present

## 2023-06-03 DIAGNOSIS — Z741 Need for assistance with personal care: Secondary | ICD-10-CM | POA: Diagnosis not present

## 2023-06-03 DIAGNOSIS — M6281 Muscle weakness (generalized): Secondary | ICD-10-CM | POA: Diagnosis not present

## 2023-06-03 DIAGNOSIS — M25549 Pain in joints of unspecified hand: Secondary | ICD-10-CM | POA: Diagnosis not present

## 2023-06-04 DIAGNOSIS — M6281 Muscle weakness (generalized): Secondary | ICD-10-CM | POA: Diagnosis not present

## 2023-06-04 DIAGNOSIS — M170 Bilateral primary osteoarthritis of knee: Secondary | ICD-10-CM | POA: Diagnosis not present

## 2023-06-04 DIAGNOSIS — Z741 Need for assistance with personal care: Secondary | ICD-10-CM | POA: Diagnosis not present

## 2023-06-04 DIAGNOSIS — R279 Unspecified lack of coordination: Secondary | ICD-10-CM | POA: Diagnosis not present

## 2023-06-04 DIAGNOSIS — M25549 Pain in joints of unspecified hand: Secondary | ICD-10-CM | POA: Diagnosis not present

## 2023-06-05 DIAGNOSIS — Z741 Need for assistance with personal care: Secondary | ICD-10-CM | POA: Diagnosis not present

## 2023-06-05 DIAGNOSIS — M6281 Muscle weakness (generalized): Secondary | ICD-10-CM | POA: Diagnosis not present

## 2023-06-05 DIAGNOSIS — R279 Unspecified lack of coordination: Secondary | ICD-10-CM | POA: Diagnosis not present

## 2023-06-05 DIAGNOSIS — M170 Bilateral primary osteoarthritis of knee: Secondary | ICD-10-CM | POA: Diagnosis not present

## 2023-06-05 DIAGNOSIS — M25549 Pain in joints of unspecified hand: Secondary | ICD-10-CM | POA: Diagnosis not present

## 2023-06-06 DIAGNOSIS — R279 Unspecified lack of coordination: Secondary | ICD-10-CM | POA: Diagnosis not present

## 2023-06-06 DIAGNOSIS — M170 Bilateral primary osteoarthritis of knee: Secondary | ICD-10-CM | POA: Diagnosis not present

## 2023-06-06 DIAGNOSIS — M25549 Pain in joints of unspecified hand: Secondary | ICD-10-CM | POA: Diagnosis not present

## 2023-06-06 DIAGNOSIS — M6281 Muscle weakness (generalized): Secondary | ICD-10-CM | POA: Diagnosis not present

## 2023-06-06 DIAGNOSIS — Z741 Need for assistance with personal care: Secondary | ICD-10-CM | POA: Diagnosis not present

## 2023-06-07 DIAGNOSIS — M6281 Muscle weakness (generalized): Secondary | ICD-10-CM | POA: Diagnosis not present

## 2023-06-07 DIAGNOSIS — R279 Unspecified lack of coordination: Secondary | ICD-10-CM | POA: Diagnosis not present

## 2023-06-07 DIAGNOSIS — Z741 Need for assistance with personal care: Secondary | ICD-10-CM | POA: Diagnosis not present

## 2023-06-07 DIAGNOSIS — M170 Bilateral primary osteoarthritis of knee: Secondary | ICD-10-CM | POA: Diagnosis not present

## 2023-06-07 DIAGNOSIS — M25549 Pain in joints of unspecified hand: Secondary | ICD-10-CM | POA: Diagnosis not present

## 2023-06-10 DIAGNOSIS — R279 Unspecified lack of coordination: Secondary | ICD-10-CM | POA: Diagnosis not present

## 2023-06-10 DIAGNOSIS — M25549 Pain in joints of unspecified hand: Secondary | ICD-10-CM | POA: Diagnosis not present

## 2023-06-10 DIAGNOSIS — M6281 Muscle weakness (generalized): Secondary | ICD-10-CM | POA: Diagnosis not present

## 2023-06-10 DIAGNOSIS — M170 Bilateral primary osteoarthritis of knee: Secondary | ICD-10-CM | POA: Diagnosis not present

## 2023-06-10 DIAGNOSIS — Z741 Need for assistance with personal care: Secondary | ICD-10-CM | POA: Diagnosis not present

## 2023-06-11 DIAGNOSIS — M6281 Muscle weakness (generalized): Secondary | ICD-10-CM | POA: Diagnosis not present

## 2023-06-11 DIAGNOSIS — Z741 Need for assistance with personal care: Secondary | ICD-10-CM | POA: Diagnosis not present

## 2023-06-11 DIAGNOSIS — M170 Bilateral primary osteoarthritis of knee: Secondary | ICD-10-CM | POA: Diagnosis not present

## 2023-06-11 DIAGNOSIS — M25549 Pain in joints of unspecified hand: Secondary | ICD-10-CM | POA: Diagnosis not present

## 2023-06-11 DIAGNOSIS — R279 Unspecified lack of coordination: Secondary | ICD-10-CM | POA: Diagnosis not present

## 2023-06-12 DIAGNOSIS — M25549 Pain in joints of unspecified hand: Secondary | ICD-10-CM | POA: Diagnosis not present

## 2023-06-12 DIAGNOSIS — M170 Bilateral primary osteoarthritis of knee: Secondary | ICD-10-CM | POA: Diagnosis not present

## 2023-06-12 DIAGNOSIS — Z741 Need for assistance with personal care: Secondary | ICD-10-CM | POA: Diagnosis not present

## 2023-06-12 DIAGNOSIS — R279 Unspecified lack of coordination: Secondary | ICD-10-CM | POA: Diagnosis not present

## 2023-06-12 DIAGNOSIS — M6281 Muscle weakness (generalized): Secondary | ICD-10-CM | POA: Diagnosis not present

## 2023-06-13 DIAGNOSIS — R279 Unspecified lack of coordination: Secondary | ICD-10-CM | POA: Diagnosis not present

## 2023-06-13 DIAGNOSIS — M170 Bilateral primary osteoarthritis of knee: Secondary | ICD-10-CM | POA: Diagnosis not present

## 2023-06-13 DIAGNOSIS — M6281 Muscle weakness (generalized): Secondary | ICD-10-CM | POA: Diagnosis not present

## 2023-06-13 DIAGNOSIS — Z741 Need for assistance with personal care: Secondary | ICD-10-CM | POA: Diagnosis not present

## 2023-06-13 DIAGNOSIS — M25549 Pain in joints of unspecified hand: Secondary | ICD-10-CM | POA: Diagnosis not present

## 2023-06-14 DIAGNOSIS — M170 Bilateral primary osteoarthritis of knee: Secondary | ICD-10-CM | POA: Diagnosis not present

## 2023-06-14 DIAGNOSIS — R279 Unspecified lack of coordination: Secondary | ICD-10-CM | POA: Diagnosis not present

## 2023-06-14 DIAGNOSIS — M6281 Muscle weakness (generalized): Secondary | ICD-10-CM | POA: Diagnosis not present

## 2023-06-14 DIAGNOSIS — M25549 Pain in joints of unspecified hand: Secondary | ICD-10-CM | POA: Diagnosis not present

## 2023-06-14 DIAGNOSIS — Z741 Need for assistance with personal care: Secondary | ICD-10-CM | POA: Diagnosis not present

## 2023-06-18 DIAGNOSIS — R279 Unspecified lack of coordination: Secondary | ICD-10-CM | POA: Diagnosis not present

## 2023-06-18 DIAGNOSIS — Z741 Need for assistance with personal care: Secondary | ICD-10-CM | POA: Diagnosis not present

## 2023-06-18 DIAGNOSIS — M170 Bilateral primary osteoarthritis of knee: Secondary | ICD-10-CM | POA: Diagnosis not present

## 2023-06-18 DIAGNOSIS — E785 Hyperlipidemia, unspecified: Secondary | ICD-10-CM | POA: Diagnosis not present

## 2023-06-18 DIAGNOSIS — D509 Iron deficiency anemia, unspecified: Secondary | ICD-10-CM | POA: Diagnosis not present

## 2023-06-18 DIAGNOSIS — M6281 Muscle weakness (generalized): Secondary | ICD-10-CM | POA: Diagnosis not present

## 2023-06-18 DIAGNOSIS — I5023 Acute on chronic systolic (congestive) heart failure: Secondary | ICD-10-CM | POA: Diagnosis not present

## 2023-06-18 DIAGNOSIS — G8929 Other chronic pain: Secondary | ICD-10-CM | POA: Diagnosis not present

## 2023-06-18 DIAGNOSIS — I10 Essential (primary) hypertension: Secondary | ICD-10-CM | POA: Diagnosis not present

## 2023-06-18 DIAGNOSIS — M25549 Pain in joints of unspecified hand: Secondary | ICD-10-CM | POA: Diagnosis not present

## 2023-06-19 DIAGNOSIS — M170 Bilateral primary osteoarthritis of knee: Secondary | ICD-10-CM | POA: Diagnosis not present

## 2023-06-19 DIAGNOSIS — M6281 Muscle weakness (generalized): Secondary | ICD-10-CM | POA: Diagnosis not present

## 2023-06-19 DIAGNOSIS — Z741 Need for assistance with personal care: Secondary | ICD-10-CM | POA: Diagnosis not present

## 2023-06-19 DIAGNOSIS — M25549 Pain in joints of unspecified hand: Secondary | ICD-10-CM | POA: Diagnosis not present

## 2023-06-19 DIAGNOSIS — R279 Unspecified lack of coordination: Secondary | ICD-10-CM | POA: Diagnosis not present

## 2023-06-20 DIAGNOSIS — R279 Unspecified lack of coordination: Secondary | ICD-10-CM | POA: Diagnosis not present

## 2023-06-20 DIAGNOSIS — M25549 Pain in joints of unspecified hand: Secondary | ICD-10-CM | POA: Diagnosis not present

## 2023-06-20 DIAGNOSIS — M6281 Muscle weakness (generalized): Secondary | ICD-10-CM | POA: Diagnosis not present

## 2023-06-20 DIAGNOSIS — M170 Bilateral primary osteoarthritis of knee: Secondary | ICD-10-CM | POA: Diagnosis not present

## 2023-06-20 DIAGNOSIS — Z741 Need for assistance with personal care: Secondary | ICD-10-CM | POA: Diagnosis not present

## 2023-06-21 DIAGNOSIS — Z741 Need for assistance with personal care: Secondary | ICD-10-CM | POA: Diagnosis not present

## 2023-06-21 DIAGNOSIS — M170 Bilateral primary osteoarthritis of knee: Secondary | ICD-10-CM | POA: Diagnosis not present

## 2023-06-21 DIAGNOSIS — M25549 Pain in joints of unspecified hand: Secondary | ICD-10-CM | POA: Diagnosis not present

## 2023-06-21 DIAGNOSIS — M6281 Muscle weakness (generalized): Secondary | ICD-10-CM | POA: Diagnosis not present

## 2023-06-21 DIAGNOSIS — R279 Unspecified lack of coordination: Secondary | ICD-10-CM | POA: Diagnosis not present

## 2023-06-24 DIAGNOSIS — M170 Bilateral primary osteoarthritis of knee: Secondary | ICD-10-CM | POA: Diagnosis not present

## 2023-06-24 DIAGNOSIS — M6281 Muscle weakness (generalized): Secondary | ICD-10-CM | POA: Diagnosis not present

## 2023-06-24 DIAGNOSIS — Z741 Need for assistance with personal care: Secondary | ICD-10-CM | POA: Diagnosis not present

## 2023-06-24 DIAGNOSIS — R279 Unspecified lack of coordination: Secondary | ICD-10-CM | POA: Diagnosis not present

## 2023-06-24 DIAGNOSIS — M25549 Pain in joints of unspecified hand: Secondary | ICD-10-CM | POA: Diagnosis not present

## 2023-06-25 DIAGNOSIS — M25549 Pain in joints of unspecified hand: Secondary | ICD-10-CM | POA: Diagnosis not present

## 2023-06-25 DIAGNOSIS — M6281 Muscle weakness (generalized): Secondary | ICD-10-CM | POA: Diagnosis not present

## 2023-06-25 DIAGNOSIS — R279 Unspecified lack of coordination: Secondary | ICD-10-CM | POA: Diagnosis not present

## 2023-06-25 DIAGNOSIS — M170 Bilateral primary osteoarthritis of knee: Secondary | ICD-10-CM | POA: Diagnosis not present

## 2023-06-25 DIAGNOSIS — Z741 Need for assistance with personal care: Secondary | ICD-10-CM | POA: Diagnosis not present

## 2023-06-26 DIAGNOSIS — M6281 Muscle weakness (generalized): Secondary | ICD-10-CM | POA: Diagnosis not present

## 2023-06-26 DIAGNOSIS — Z741 Need for assistance with personal care: Secondary | ICD-10-CM | POA: Diagnosis not present

## 2023-06-26 DIAGNOSIS — M25549 Pain in joints of unspecified hand: Secondary | ICD-10-CM | POA: Diagnosis not present

## 2023-06-26 DIAGNOSIS — R279 Unspecified lack of coordination: Secondary | ICD-10-CM | POA: Diagnosis not present

## 2023-06-26 DIAGNOSIS — M170 Bilateral primary osteoarthritis of knee: Secondary | ICD-10-CM | POA: Diagnosis not present

## 2023-06-27 DIAGNOSIS — R279 Unspecified lack of coordination: Secondary | ICD-10-CM | POA: Diagnosis not present

## 2023-06-27 DIAGNOSIS — M6281 Muscle weakness (generalized): Secondary | ICD-10-CM | POA: Diagnosis not present

## 2023-06-27 DIAGNOSIS — M25549 Pain in joints of unspecified hand: Secondary | ICD-10-CM | POA: Diagnosis not present

## 2023-06-27 DIAGNOSIS — M170 Bilateral primary osteoarthritis of knee: Secondary | ICD-10-CM | POA: Diagnosis not present

## 2023-06-27 DIAGNOSIS — Z741 Need for assistance with personal care: Secondary | ICD-10-CM | POA: Diagnosis not present

## 2023-07-02 DIAGNOSIS — I5023 Acute on chronic systolic (congestive) heart failure: Secondary | ICD-10-CM | POA: Diagnosis not present

## 2023-07-08 DIAGNOSIS — I509 Heart failure, unspecified: Secondary | ICD-10-CM | POA: Diagnosis not present

## 2023-07-08 DIAGNOSIS — Z7901 Long term (current) use of anticoagulants: Secondary | ICD-10-CM | POA: Diagnosis not present

## 2023-07-08 DIAGNOSIS — I2699 Other pulmonary embolism without acute cor pulmonale: Secondary | ICD-10-CM | POA: Diagnosis not present

## 2023-07-08 DIAGNOSIS — I130 Hypertensive heart and chronic kidney disease with heart failure and stage 1 through stage 4 chronic kidney disease, or unspecified chronic kidney disease: Secondary | ICD-10-CM | POA: Diagnosis not present

## 2023-07-08 DIAGNOSIS — J309 Allergic rhinitis, unspecified: Secondary | ICD-10-CM | POA: Diagnosis not present

## 2023-07-08 DIAGNOSIS — N189 Chronic kidney disease, unspecified: Secondary | ICD-10-CM | POA: Diagnosis not present

## 2023-07-16 DIAGNOSIS — E559 Vitamin D deficiency, unspecified: Secondary | ICD-10-CM | POA: Diagnosis not present

## 2023-07-16 DIAGNOSIS — G8929 Other chronic pain: Secondary | ICD-10-CM | POA: Diagnosis not present

## 2023-08-13 DIAGNOSIS — G8929 Other chronic pain: Secondary | ICD-10-CM | POA: Diagnosis not present

## 2023-08-13 DIAGNOSIS — Z79899 Other long term (current) drug therapy: Secondary | ICD-10-CM | POA: Diagnosis not present

## 2023-08-13 DIAGNOSIS — E039 Hypothyroidism, unspecified: Secondary | ICD-10-CM | POA: Diagnosis not present
